# Patient Record
Sex: Male | Born: 1955 | Race: White | Hispanic: No | Marital: Married | Smoking: Former smoker
Health system: Southern US, Community
[De-identification: ages and names within clinical notes are randomized; demographics above are authoritative.]

## PROBLEM LIST (undated history)

## (undated) DIAGNOSIS — C61 Malignant neoplasm of prostate: Secondary | ICD-10-CM

## (undated) DIAGNOSIS — Z7189 Other specified counseling: Secondary | ICD-10-CM

## (undated) DIAGNOSIS — H9319 Tinnitus, unspecified ear: Secondary | ICD-10-CM

## (undated) DIAGNOSIS — C801 Malignant (primary) neoplasm, unspecified: Secondary | ICD-10-CM

## (undated) DIAGNOSIS — M1990 Unspecified osteoarthritis, unspecified site: Secondary | ICD-10-CM

## (undated) HISTORY — PX: BACK SURGERY: SHX140

## (undated) HISTORY — DX: Malignant (primary) neoplasm, unspecified: C80.1

## (undated) HISTORY — PX: KNEE ARTHROSCOPY: SUR90

---
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
## 2003-07-02 ENCOUNTER — Encounter: Payer: Self-pay | Admitting: Neurology

## 2003-07-02 ENCOUNTER — Encounter: Payer: Self-pay | Admitting: Cardiology

## 2003-07-02 ENCOUNTER — Encounter: Payer: Self-pay | Admitting: Emergency Medicine

## 2003-07-02 ENCOUNTER — Observation Stay (HOSPITAL_COMMUNITY): Admission: EM | Admit: 2003-07-02 | Discharge: 2003-07-03 | Payer: Self-pay | Admitting: Emergency Medicine

## 2005-03-29 ENCOUNTER — Ambulatory Visit (HOSPITAL_COMMUNITY): Admission: RE | Admit: 2005-03-29 | Discharge: 2005-03-29 | Payer: Self-pay | Admitting: Gastroenterology

## 2007-10-03 DIAGNOSIS — E782 Mixed hyperlipidemia: Secondary | ICD-10-CM | POA: Insufficient documentation

## 2009-12-24 ENCOUNTER — Ambulatory Visit (HOSPITAL_COMMUNITY): Admission: RE | Admit: 2009-12-24 | Discharge: 2009-12-24 | Payer: Self-pay | Admitting: Neurosurgery

## 2011-02-20 LAB — URINALYSIS, ROUTINE W REFLEX MICROSCOPIC
Bilirubin Urine: NEGATIVE
Glucose, UA: NEGATIVE mg/dL
Ketones, ur: NEGATIVE mg/dL
Urobilinogen, UA: 0.2 mg/dL (ref 0.0–1.0)
pH: 5.5 (ref 5.0–8.0)

## 2011-02-20 LAB — CBC
MCHC: 35.3 g/dL (ref 30.0–36.0)
RBC: 4.43 MIL/uL (ref 4.22–5.81)
RDW: 12.8 % (ref 11.5–15.5)
WBC: 6.9 10*3/uL (ref 4.0–10.5)

## 2011-02-20 LAB — BASIC METABOLIC PANEL
CO2: 27 mEq/L (ref 19–32)
Calcium: 9.1 mg/dL (ref 8.4–10.5)
Creatinine, Ser: 0.85 mg/dL (ref 0.4–1.5)
GFR calc non Af Amer: >60 mL/min (ref >60)
Glucose, Bld: 100 mg/dL — ABNORMAL HIGH (ref 70–99)

## 2011-02-20 LAB — PROTIME-INR: INR: 1.02 (ref 0.00–1.49)

## 2011-02-20 LAB — APTT: aPTT: 26 seconds (ref 24–37)

## 2011-04-22 NOTE — H&P (Signed)
NAMEMELVILLE, ENGEN                        ACCOUNT NO.:  [id number]   MEDICAL RECORD NO.:  [id number]                   PATIENT TYPE:  INP   LOCATION:                                       FACILITY:  [facility]   PHYSICIAN:  Santina Evans A. Orlin Hilding, M.D.          DATE OF BIRTH:  10-14-1956   DATE OF ADMISSION:  07/02/2003  DATE OF DISCHARGE:                                HISTORY & PHYSICAL   CHIEF COMPLAINT:  Staggering and slurred speech.   HISTORY OF PRESENT ILLNESS:  The patient is a 55 year old right-handed white  male with a history of hypertension in generally good health.  Last few days  he reports a little bit of choking on liquids, but otherwise well.  He went  to bed and felt fine last night.  Got up around 5:00 as usual this morning  and felt dizzy, unsteady, had slurred speech, double vision, and was  staggering.  He fell when he tried to walk.  He was nauseated and vomited  once.  Came to the emergency room by family car where he was noted to have  some nystagmus and left finger-nose dysmetria and slurred speech that has  been gradually clearing.   REVIEW OF SYSTEMS:  Negative for headache, chest pain, shortness of breath.  He is hard of hearing on the left.  He has had low back pain.   PAST MEDICAL HISTORY:  1. Hypertension.  2. Back pain.  3. Left leg surgery to correct bowing.  4. Hyperlipidemia which is diet controlled.   MEDICATIONS:  Something that starts with an H for his blood pressure.  I  tried hydrochlorothiazide.  They do not recognize that.   ALLERGIES:  No known drug allergies.   SOCIAL HISTORY:  He is married.  Remote cigarette use.  No alcohol or drug  use.  He is a Corporate investment banker.   FAMILY HISTORY:  Positive for hypertension.   PHYSICAL EXAMINATION:  VITAL SIGNS:  Temperature 98.9, pulse 72, blood  pressure 135/96, respirations 18, 97% saturation on 2 L.  HEENT:  Head is normocephalic, atraumatic.  NECK:  Supple without bruits.  HEART:   Regular rate and rhythm.  LUNGS:  Clear to auscultation.  ABDOMEN:  Benign.  EXTREMITIES:  Without edema.  NEUROLOGIC:  Mental status:  Awake, alert, normal naming, repetition, and  comprehension.  Cranial nerves II:  Pupils are equal and reactive.  Disk  margins are sharp.  Visual fields full to confrontation __________  .  Extraocular movements are intact without nystagmus, ophthalmoparesis or  ptosis.  Facial sensation is normal, facial motor activity is intact without  any weakness, droop, or asymmetry.  Hearing is diminished on the left,  normal on the right.  Palate is symmetric.  Tongue is midline.  He may have  had a few beats of palatal myoclonus.  It might have been voluntary,  however.  Shoulder shrug is normal.  On motor examination he has normal  bulk, tone, and strength throughout, mildly unsteady standing with a little  swaying back and forth.  Reflexes are trace in the upper and lower  extremities with downgoing toes to plantar stimulation.  Coordination:  Finger-to-nose, heel-to-shin, rapid alternating movements are normal.  I did  not try tandem gait because of the unsteadiness and swaying.  Sensory  examination is intact.   LABORATORIES:  CT scan of the brain is normal.  EKG is normal sinus rhythm.  Chest x-ray pending.  CBC and CMET are all essentially normal.  CK was  normal.  Troponin was less than 0.01.  PT 12.9, INR 1.0, PTT 26.   IMPRESSION:  Posterior circulation symptoms with dizziness, falls,  unsteadiness, dysarthria, diplopia, nystagmus, dysmetria, nausea, and  vomiting all rapidly clearing.  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  Need to rule out cerebellar or brain stem stroke.  Risk  factors hypertension, hypercholesterolemia.   PLAN:  Admit to stroke service for work-up.  MRI of the brain, MR angiogram,  2-D echocardiogram, carotid Doppler, urine drug  screen, and homocysteine.  Will need ST, OT, and PT evaluations and aspirin will be started on a  regular basis.                                                Catherine A. Orlin Hilding, M.D.    CAW/MEDQ  D:  07/02/2003  T:  07/02/2003  Job:  [id number]

## 2011-04-22 NOTE — Op Note (Signed)
Frederick Cruz, Frederick Cruz              ACCOUNT NO.:  [id number]   MEDICAL RECORD NO.:  [id number]          PATIENT TYPE:  AMB   LOCATION:  ENDO                         FACILITY:  [facility]   PHYSICIAN:  James L. Malon Kindle., M.D.DATE OF BIRTH:  [birth year]   DATE OF PROCEDURE:  03/29/2005  DATE OF DISCHARGE:                                 OPERATIVE REPORT   PROCEDURE PERFORMED:  Colonoscopy.   ENDOSCOPIST:  Llana Aliment. Edwards, M.D.   MEDICATIONS:  Fentanyl 75 mcg, Versed 10.5 mg IV.   INSTRUMENT USED:  Olympus pediatric adjustable colonoscope.   INDICATIONS FOR PROCEDURE:  Screening colonoscopy.   DESCRIPTION OF PROCEDURE:  The procedure had been explained to the patient  and consent obtained.  With the patient in the left lateral decubitus  position, the Olympus scope was inserted and advanced.  Prep excellent.  We  were able to advance to the cecum without difficulty.  The ileocecal valve  and appendiceal orifice were seen.  The scope was withdrawn and the cecum,  ascending colon, transverse colon, splenic flexure, descending and sigmoid  colon were seen well upon removal.  No polyps or other lesions seen.  The  rectum was seen well.  There were no polyps in the rectum.  The scope was  withdrawn.  The patient tolerated the procedure well.   ASSESSMENT:  Screening colonoscopy, normal, V76.51.   PLAN:  Will recommend yearly Hemoccults.  Repeat colonoscopy in 10 years.      JLE/MEDQ  D:  03/29/2005  T:  03/29/2005  Job:  [id number]   cc:   Schuyler Amor, M.D.  [address]  [place], [place] [address]  Fax: [phone]

## 2013-10-15 ENCOUNTER — Encounter (HOSPITAL_COMMUNITY): Payer: Self-pay | Admitting: Pharmacy Technician

## 2013-10-15 NOTE — H&P (Signed)
TOTAL KNEE ADMISSION H&P  Patient is being admitted for right total knee arthroplasty.  Subjective:  Chief Complaint:right knee pain.  HPI: Frederick Cruz, 57 y.o. male, has a history of pain and functional disability in the right knee due to arthritis and has failed non-surgical conservative treatments for greater than 12 weeks to includeNSAID's and/or analgesics, corticosteriod injections and activity modification.  Onset of symptoms was gradual, starting 1 year ago with gradually worsening course since that time. The patient noted no past surgery on the right knee(s).  Patient currently rates pain in the right knee(s) at 7 out of 10 with activity. Patient has night pain, worsening of pain with activity and weight bearing, pain that interferes with activities of daily living, pain with passive range of motion, crepitus and joint swelling.  Patient has evidence of periarticular osteophytes and joint space narrowing by imaging studies.  There is no active infection.  Past Medical History Osteoarthritis  Lumbar disc herniation  Past Surgical history Left knee arthroscopy Lumbar microdiscectomy  NKDA  History  Substance Use Topics  . Smoking status: Past Smoker  . Smokeless tobacco: None  . Alcohol Use: Rare    Family History Diabetes mellitus, type II Dementia Cerebrovascular accident   Review of Systems  Constitutional: Negative.   HENT: Positive for tinnitus. Negative for congestion, ear discharge, ear pain, hearing loss, nosebleeds and sore throat.   Eyes: Negative.   Respiratory: Negative.  Negative for stridor.   Cardiovascular: Negative.   Gastrointestinal: Negative.   Genitourinary: Negative.   Musculoskeletal: Positive for joint pain. Negative for back pain, falls, myalgias and neck pain.       Right knee pain  Neurological: Negative.  Negative for headaches.  Endo/Heme/Allergies: Negative.   Psychiatric/Behavioral: Negative.     Objective:  Physical Exam   Constitutional: He is oriented to person, place, and time. He appears well-developed and well-nourished. No distress.  HENT:  Head: Normocephalic and atraumatic.  Right Ear: External ear normal.  Left Ear: External ear normal.  Nose: Nose normal.  Mouth/Throat: Oropharynx is clear and moist.  Eyes: Conjunctivae and EOM are normal.  Neck: Normal range of motion. Neck supple.  Cardiovascular: Normal rate, regular rhythm, normal heart sounds and intact distal pulses.   No murmur heard. Respiratory: Breath sounds normal. No respiratory distress. He has no wheezes.  GI: Soft. Bowel sounds are normal. He exhibits no distension. There is no tenderness.  Musculoskeletal:       Right hip: Normal.       Left hip: Normal.       Right knee: He exhibits decreased range of motion and swelling. He exhibits no effusion and no erythema. Tenderness found. Medial joint line and lateral joint line tenderness noted.       Left knee: Normal.       Right lower leg: He exhibits no tenderness and no swelling.       Left lower leg: He exhibits no tenderness and no swelling.  Neurological: He is alert and oriented to person, place, and time. He has normal strength and normal reflexes. No sensory deficit.  Skin: No rash noted. He is not diaphoretic. No erythema.  Psychiatric: He has a normal mood and affect. His behavior is normal.    Vitals Weight: 220 lb Height: 69.5 in Body Surface Area: 2.21 m Body Mass Index: 32.02 kg/m Pulse: 68 (Regular) BP: 141/91 (Sitting, Left Arm, Standard)   Imaging Review Plain radiographs demonstrate severe degenerative joint disease of the right  knee(s). The overall alignment ismild varus. The bone quality appears to be good for age and reported activity level.  Assessment/Plan:  End stage arthritis, right knee   The patient history, physical examination, clinical judgment of the provider and imaging studies are consistent with end stage degenerative joint  disease of the right knee(s) and total knee arthroplasty is deemed medically necessary. The treatment options including medical management, injection therapy arthroscopy and arthroplasty were discussed at length. The risks and benefits of total knee arthroplasty were presented and reviewed. The risks due to aseptic loosening, infection, stiffness, patella tracking problems, thromboembolic complications and other imponderables were discussed. The patient acknowledged the explanation, agreed to proceed with the plan and consent was signed. Patient is being admitted for inpatient treatment for surgery, pain control, PT, OT, prophylactic antibiotics, VTE prophylaxis, progressive ambulation and ADL's and discharge planning. The patient is planning to be discharged home with home health services      [place], [place]

## 2013-10-16 ENCOUNTER — Other Ambulatory Visit (HOSPITAL_COMMUNITY): Payer: Self-pay | Admitting: *Deleted

## 2013-10-18 ENCOUNTER — Encounter (HOSPITAL_COMMUNITY)
Admission: RE | Admit: 2013-10-18 | Discharge: 2013-10-18 | Disposition: A | Discharge status: Home / Self Care | Payer: BC Managed Care – PPO | Source: Ambulatory Visit | Attending: Orthopedic Surgery | Admitting: Orthopedic Surgery

## 2013-10-18 ENCOUNTER — Encounter (HOSPITAL_COMMUNITY): Payer: Self-pay

## 2013-10-18 DIAGNOSIS — Z01812 Encounter for preprocedural laboratory examination: Secondary | ICD-10-CM | POA: Insufficient documentation

## 2013-10-18 HISTORY — DX: Unspecified osteoarthritis, unspecified site: M19.90

## 2013-10-18 HISTORY — DX: Tinnitus, unspecified ear: H93.19

## 2013-10-18 LAB — URINALYSIS, ROUTINE W REFLEX MICROSCOPIC
Bilirubin Urine: NEGATIVE
Glucose, UA: NEGATIVE mg/dL
Hgb urine dipstick: NEGATIVE
Ketones, ur: NEGATIVE mg/dL
Leukocytes, UA: NEGATIVE
Nitrite: NEGATIVE
Protein, ur: NEGATIVE mg/dL
Specific Gravity, Urine: 1.017 (ref 1.005–1.030)
Urobilinogen, UA: 0.2 mg/dL (ref 0.0–1.0)
pH: 7.5 (ref 5.0–8.0)

## 2013-10-18 LAB — CBC
HCT: 42.2 % (ref 39.0–52.0)
Hemoglobin: 14.9 g/dL (ref 13.0–17.0)
MCH: 31.8 pg (ref 26.0–34.0)
MCHC: 35.3 g/dL (ref 30.0–36.0)
Platelets: 228 10*3/uL (ref 150–400)
RBC: 4.69 MIL/uL (ref 4.22–5.81)

## 2013-10-18 LAB — SURGICAL PCR SCREEN
MRSA, PCR: INVALID — AB
Staphylococcus aureus: INVALID — AB

## 2013-10-18 LAB — COMPREHENSIVE METABOLIC PANEL
ALT: 18 U/L (ref 0–53)
AST: 21 U/L (ref 0–37)
Albumin: 4.2 g/dL (ref 3.5–5.2)
Alkaline Phosphatase: 55 U/L (ref 39–117)
BUN: 18 mg/dL (ref 6–23)
CO2: 25 mEq/L (ref 19–32)
Calcium: 9.3 mg/dL (ref 8.4–10.5)
Chloride: 104 mEq/L (ref 96–112)
Creatinine, Ser: 0.84 mg/dL (ref 0.50–1.35)
GFR calc Af Amer: >90 mL/min (ref >90)
GFR calc non Af Amer: >90 mL/min (ref >90)
Glucose, Bld: 86 mg/dL (ref 70–99)
Potassium: 4.6 mEq/L (ref 3.5–5.1)
Sodium: 137 mEq/L (ref 135–145)
Total Bilirubin: 0.3 mg/dL (ref 0.3–1.2)
Total Protein: 7.6 g/dL (ref 6.0–8.3)

## 2013-10-18 LAB — APTT: aPTT: 27 seconds (ref 24–37)

## 2013-10-18 LAB — PROTIME-INR
INR: 0.98 (ref 0.00–1.49)
Prothrombin Time: 12.8 seconds (ref 11.6–15.2)

## 2013-10-18 NOTE — Progress Notes (Signed)
10/18/13 1154  OBSTRUCTIVE SLEEP APNEA  Have you ever been diagnosed with sleep apnea through a sleep study? No  Do you snore loudly (loud enough to be heard through closed doors)?  1  Do you often feel tired, fatigued, or sleepy during the daytime? 1  Has anyone observed you stop breathing during your sleep? 0  Do you have, or are you being treated for high blood pressure? 0  BMI more than 35 kg/m2? 0  Age over 59 years old? 1  Neck circumference greater than 40 cm/18 inches? 1  Gender: 1  Obstructive Sleep Apnea Score 5  Score 4 or greater  Results sent to PCP

## 2013-10-18 NOTE — Patient Instructions (Addendum)
Frederick Cruz  10/18/2013                           YOUR PROCEDURE IS SCHEDULED ON:  10/23/13               PLEASE REPORT TO SHORT STAY CENTER AT : 6:00 AM               CALL THIS NUMBER IF ANY PROBLEMS THE DAY OF SURGERY :               [phone]                      REMEMBER:   Do not eat food or drink liquids AFTER MIDNIGHT   Take these medicines the morning of surgery with A SIP OF WATER:  NONE   Do not wear jewelry, make-up   Do not wear lotions, powders, or perfumes.   Do not shave legs or underarms 12 hrs. before surgery (men may shave face)  Do not bring valuables to the hospital.  Contacts, dentures or bridgework may not be worn into surgery.  Leave suitcase in the car. After surgery it may be brought to your room.  For patients admitted to the hospital more than one night, checkout time is 11:00                          The day of discharge.   Patients discharged the day of surgery will not be allowed to drive home                             If going home same day of surgery, must have someone stay with you first                           24 hrs at home and arrange for some one to drive you home from hospital.    Special Instructions:   Please read over the following fact sheets that you were given:               1. MRSA  INFORMATION                      2. [facility] PREPARING FOR SURGERY SHEET               3. INCENTIVE SPIROMETER                                                X_____________________________________________________________________        Failure to follow these instructions may result in cancellation of your surgery

## 2013-10-21 LAB — MRSA CULTURE

## 2013-10-23 ENCOUNTER — Ambulatory Visit (HOSPITAL_COMMUNITY): Payer: BC Managed Care – PPO | Admitting: Anesthesiology

## 2013-10-23 ENCOUNTER — Inpatient Hospital Stay (HOSPITAL_COMMUNITY)
Admission: RE | Admit: 2013-10-23 | Discharge: 2013-10-25 | DRG: 470 | Disposition: A | Discharge status: Home Health | Payer: BC Managed Care – PPO | Source: Ambulatory Visit | Attending: Orthopedic Surgery | Admitting: Orthopedic Surgery

## 2013-10-23 ENCOUNTER — Encounter (HOSPITAL_COMMUNITY): Payer: Self-pay | Admitting: *Deleted

## 2013-10-23 ENCOUNTER — Ambulatory Visit (HOSPITAL_COMMUNITY): Payer: BC Managed Care – PPO

## 2013-10-23 ENCOUNTER — Encounter (HOSPITAL_COMMUNITY): Payer: BC Managed Care – PPO | Admitting: Anesthesiology

## 2013-10-23 ENCOUNTER — Encounter (HOSPITAL_COMMUNITY)
Admission: RE | Disposition: A | Discharge status: Home Health | Payer: Self-pay | Source: Ambulatory Visit | Attending: Orthopedic Surgery

## 2013-10-23 DIAGNOSIS — Z8489 Family history of other specified conditions: Secondary | ICD-10-CM

## 2013-10-23 DIAGNOSIS — M1710 Unilateral primary osteoarthritis, unspecified knee: Principal | ICD-10-CM | POA: Diagnosis present

## 2013-10-23 DIAGNOSIS — Z823 Family history of stroke: Secondary | ICD-10-CM

## 2013-10-23 DIAGNOSIS — Z96651 Presence of right artificial knee joint: Secondary | ICD-10-CM

## 2013-10-23 DIAGNOSIS — M1711 Unilateral primary osteoarthritis, right knee: Secondary | ICD-10-CM | POA: Diagnosis present

## 2013-10-23 DIAGNOSIS — Z833 Family history of diabetes mellitus: Secondary | ICD-10-CM

## 2013-10-23 DIAGNOSIS — Z87891 Personal history of nicotine dependence: Secondary | ICD-10-CM

## 2013-10-23 DIAGNOSIS — D62 Acute posthemorrhagic anemia: Secondary | ICD-10-CM | POA: Diagnosis not present

## 2013-10-23 HISTORY — PX: TOTAL KNEE ARTHROPLASTY: SHX125

## 2013-10-23 LAB — TYPE AND SCREEN
ABO/RH(D): A POS
Antibody Screen: NEGATIVE

## 2013-10-23 SURGERY — ARTHROPLASTY, KNEE, TOTAL
Anesthesia: General | Site: Knee | Laterality: Right | Wound class: Clean

## 2013-10-23 MED ORDER — MIDAZOLAM HCL 5 MG/5ML IJ SOLN
INTRAMUSCULAR | Status: DC | PRN
  Administered 2013-10-23: 2 mg via INTRAVENOUS

## 2013-10-23 MED ORDER — ROCURONIUM BROMIDE 100 MG/10ML IV SOLN
INTRAVENOUS | Status: AC
  Filled 2013-10-23: qty 1

## 2013-10-23 MED ORDER — RIVAROXABAN 10 MG PO TABS: 10.0000 mg | ORAL_TABLET | Freq: Every day | ORAL | Status: DC

## 2013-10-23 MED ORDER — CHLORHEXIDINE GLUCONATE 4 % EX LIQD: 60.0000 mL | Freq: Once | CUTANEOUS | Status: DC

## 2013-10-23 MED ORDER — SODIUM CHLORIDE 0.9 % IJ SOLN
INTRAMUSCULAR | Status: AC
  Filled 2013-10-23: qty 50

## 2013-10-23 MED ORDER — HYDROMORPHONE HCL PF 1 MG/ML IJ SOLN
INTRAMUSCULAR | Status: AC
  Administered 2013-10-23: 0.5 mg via INTRAVENOUS
  Filled 2013-10-23: qty 1

## 2013-10-23 MED ORDER — SUCCINYLCHOLINE CHLORIDE 20 MG/ML IJ SOLN
INTRAMUSCULAR | Status: DC | PRN
  Administered 2013-10-23: 100 mg via INTRAVENOUS

## 2013-10-23 MED ORDER — BUPIVACAINE LIPOSOME 1.3 % IJ SUSP
20.0000 mL | Freq: Once | INTRAMUSCULAR | Status: DC
  Filled 2013-10-23: qty 20

## 2013-10-23 MED ORDER — SODIUM CHLORIDE 0.9 % IJ SOLN
INTRAMUSCULAR | Status: DC | PRN
  Administered 2013-10-23: 20 mL

## 2013-10-23 MED ORDER — HYDROMORPHONE HCL PF 1 MG/ML IJ SOLN
0.2500 mg | INTRAMUSCULAR | Status: DC | PRN
  Administered 2013-10-23 (×4): 0.5 mg via INTRAVENOUS

## 2013-10-23 MED ORDER — PROMETHAZINE HCL 25 MG/ML IJ SOLN
12.5000 mg | Freq: Four times a day (QID) | INTRAMUSCULAR | Status: DC | PRN
  Administered 2013-10-23 – 2013-10-24 (×2): 12.5 mg via INTRAVENOUS
  Filled 2013-10-23 (×2): qty 1

## 2013-10-23 MED ORDER — THROMBIN 5000 UNITS EX SOLR
CUTANEOUS | Status: AC
  Filled 2013-10-23: qty 10000

## 2013-10-23 MED ORDER — SODIUM CHLORIDE 0.9 % IR SOLN: Status: DC | PRN

## 2013-10-23 MED ORDER — POLYETHYLENE GLYCOL 3350 17 G PO PACK
17.0000 g | PACK | Freq: Every day | ORAL | Status: DC | PRN
  Filled 2013-10-23: qty 1

## 2013-10-23 MED ORDER — MIDAZOLAM HCL 2 MG/2ML IJ SOLN
INTRAMUSCULAR | Status: AC
  Filled 2013-10-23: qty 2

## 2013-10-23 MED ORDER — FERROUS SULFATE 325 (65 FE) MG PO TABS
325.0000 mg | ORAL_TABLET | Freq: Three times a day (TID) | ORAL | Status: DC
  Administered 2013-10-24 – 2013-10-25 (×4): 325 mg via ORAL
  Filled 2013-10-23 (×9): qty 1

## 2013-10-23 MED ORDER — SUCCINYLCHOLINE CHLORIDE 20 MG/ML IJ SOLN
INTRAMUSCULAR | Status: AC
  Filled 2013-10-23: qty 1

## 2013-10-23 MED ORDER — HYDRALAZINE HCL 20 MG/ML IJ SOLN
INTRAMUSCULAR | Status: AC
  Filled 2013-10-23: qty 1

## 2013-10-23 MED ORDER — ACETAMINOPHEN 325 MG PO TABS: 650.0000 mg | ORAL_TABLET | Freq: Four times a day (QID) | ORAL | Status: DC | PRN

## 2013-10-23 MED ORDER — ALUM & MAG HYDROXIDE-SIMETH 200-200-20 MG/5ML PO SUSP
30.0000 mL | ORAL | Status: DC | PRN
  Filled 2013-10-23: qty 30

## 2013-10-23 MED ORDER — CEFAZOLIN SODIUM-DEXTROSE 2-3 GM-% IV SOLR
INTRAVENOUS | Status: AC
  Filled 2013-10-23: qty 50

## 2013-10-23 MED ORDER — BISACODYL 10 MG RE SUPP
10.0000 mg | Freq: Every day | RECTAL | Status: DC | PRN
  Filled 2013-10-23: qty 1

## 2013-10-23 MED ORDER — FLEET ENEMA 7-19 GM/118ML RE ENEM
1.0000 | ENEMA | Freq: Once | RECTAL | Status: AC | PRN
  Filled 2013-10-23: qty 1

## 2013-10-23 MED ORDER — METHOCARBAMOL 500 MG PO TABS: 500.0000 mg | ORAL_TABLET | Freq: Four times a day (QID) | ORAL | Status: DC | PRN

## 2013-10-23 MED ORDER — OXYCODONE-ACETAMINOPHEN 5-325 MG PO TABS
2.0000 | ORAL_TABLET | ORAL | Status: DC | PRN
  Administered 2013-10-23: 1 via ORAL
  Administered 2013-10-24 – 2013-10-25 (×4): 2 via ORAL
  Filled 2013-10-23 (×5): qty 2

## 2013-10-23 MED ORDER — METHOCARBAMOL 500 MG PO TABS
500.0000 mg | ORAL_TABLET | Freq: Four times a day (QID) | ORAL | Status: DC | PRN
  Administered 2013-10-24 – 2013-10-25 (×3): 500 mg via ORAL
  Filled 2013-10-23 (×4): qty 1

## 2013-10-23 MED ORDER — OXYCODONE-ACETAMINOPHEN 5-325 MG PO TABS: 2.0000 | ORAL_TABLET | ORAL | Status: DC | PRN

## 2013-10-23 MED ORDER — ONDANSETRON HCL 4 MG/2ML IJ SOLN
INTRAMUSCULAR | Status: DC | PRN
  Administered 2013-10-23: 4 mg via INTRAVENOUS

## 2013-10-23 MED ORDER — HYDROCODONE-ACETAMINOPHEN 5-325 MG PO TABS
1.0000 | ORAL_TABLET | ORAL | Status: DC | PRN
  Administered 2013-10-24 – 2013-10-25 (×3): 2 via ORAL
  Filled 2013-10-23 (×3): qty 2

## 2013-10-23 MED ORDER — ROCURONIUM BROMIDE 100 MG/10ML IV SOLN
INTRAVENOUS | Status: DC | PRN
  Administered 2013-10-23: 30 mg via INTRAVENOUS

## 2013-10-23 MED ORDER — LIDOCAINE HCL (CARDIAC) 20 MG/ML IV SOLN
INTRAVENOUS | Status: AC
  Filled 2013-10-23: qty 5

## 2013-10-23 MED ORDER — NEOSTIGMINE METHYLSULFATE 1 MG/ML IJ SOLN
INTRAMUSCULAR | Status: AC
  Filled 2013-10-23: qty 10

## 2013-10-23 MED ORDER — HYDROMORPHONE HCL PF 1 MG/ML IJ SOLN
INTRAMUSCULAR | Status: AC
  Filled 2013-10-23: qty 1

## 2013-10-23 MED ORDER — MENTHOL 3 MG MT LOZG
1.0000 | LOZENGE | OROMUCOSAL | Status: DC | PRN
  Filled 2013-10-23: qty 9

## 2013-10-23 MED ORDER — CEFAZOLIN SODIUM-DEXTROSE 2-3 GM-% IV SOLR
2.0000 g | INTRAVENOUS | Status: AC
  Administered 2013-10-23: 2 g via INTRAVENOUS

## 2013-10-23 MED ORDER — PROMETHAZINE HCL 25 MG/ML IJ SOLN: 6.2500 mg | INTRAMUSCULAR | Status: DC | PRN

## 2013-10-23 MED ORDER — PHENOL 1.4 % MT LIQD
1.0000 | OROMUCOSAL | Status: DC | PRN
  Filled 2013-10-23: qty 177

## 2013-10-23 MED ORDER — GLYCOPYRROLATE 0.2 MG/ML IJ SOLN
INTRAMUSCULAR | Status: AC
  Filled 2013-10-23: qty 1

## 2013-10-23 MED ORDER — CELECOXIB 200 MG PO CAPS
200.0000 mg | ORAL_CAPSULE | Freq: Two times a day (BID) | ORAL | Status: DC
  Administered 2013-10-23 – 2013-10-25 (×4): 200 mg via ORAL
  Filled 2013-10-23 (×7): qty 1

## 2013-10-23 MED ORDER — GLYCOPYRROLATE 0.2 MG/ML IJ SOLN
INTRAMUSCULAR | Status: DC | PRN
  Administered 2013-10-23: 0.4 mg via INTRAVENOUS

## 2013-10-23 MED ORDER — HYDROMORPHONE HCL PF 1 MG/ML IJ SOLN
1.0000 mg | INTRAMUSCULAR | Status: DC | PRN
  Administered 2013-10-23 (×2): 0.5 mg via INTRAVENOUS
  Administered 2013-10-24: 1 mg via INTRAVENOUS
  Filled 2013-10-23 (×4): qty 1

## 2013-10-23 MED ORDER — ACETAMINOPHEN 650 MG RE SUPP
650.0000 mg | Freq: Four times a day (QID) | RECTAL | Status: DC | PRN
  Filled 2013-10-23: qty 1

## 2013-10-23 MED ORDER — NEOSTIGMINE METHYLSULFATE 1 MG/ML IJ SOLN
INTRAMUSCULAR | Status: DC | PRN
  Administered 2013-10-23: 3 mg via INTRAVENOUS

## 2013-10-23 MED ORDER — HYDRALAZINE HCL 20 MG/ML IJ SOLN
5.0000 mg | Freq: Four times a day (QID) | INTRAMUSCULAR | Status: DC | PRN
  Administered 2013-10-23 (×2): 5 mg via INTRAVENOUS

## 2013-10-23 MED ORDER — POLYMYXIN B SULFATE 500000 UNITS IJ SOLR
INTRAMUSCULAR | Status: DC | PRN
  Administered 2013-10-23: 09:00:00

## 2013-10-23 MED ORDER — PROPOFOL 10 MG/ML IV BOLUS
INTRAVENOUS | Status: AC
  Filled 2013-10-23: qty 20

## 2013-10-23 MED ORDER — SODIUM CHLORIDE 0.9 % IJ SOLN
INTRAMUSCULAR | Status: DC | PRN
  Administered 2013-10-23: 10:00:00

## 2013-10-23 MED ORDER — ONDANSETRON HCL 4 MG/2ML IJ SOLN
INTRAMUSCULAR | Status: AC
  Filled 2013-10-23: qty 2

## 2013-10-23 MED ORDER — PROPOFOL 10 MG/ML IV BOLUS
INTRAVENOUS | Status: DC | PRN
  Administered 2013-10-23: 180 mg via INTRAVENOUS

## 2013-10-23 MED ORDER — LACTATED RINGERS IV SOLN
INTRAVENOUS | Status: DC
  Administered 2013-10-23 (×2): via INTRAVENOUS

## 2013-10-23 MED ORDER — LACTATED RINGERS IV SOLN: INTRAVENOUS | Status: DC

## 2013-10-23 MED ORDER — RIVAROXABAN 10 MG PO TABS
10.0000 mg | ORAL_TABLET | Freq: Every day | ORAL | Status: DC
  Administered 2013-10-24 – 2013-10-25 (×2): 10 mg via ORAL
  Filled 2013-10-23 (×3): qty 1

## 2013-10-23 MED ORDER — FENTANYL CITRATE 0.05 MG/ML IJ SOLN
INTRAMUSCULAR | Status: DC | PRN
  Administered 2013-10-23: 50 ug via INTRAVENOUS
  Administered 2013-10-23 (×2): 100 ug via INTRAVENOUS

## 2013-10-23 MED ORDER — THROMBIN 5000 UNITS EX SOLR
CUTANEOUS | Status: DC | PRN
  Administered 2013-10-23: 10000 [IU] via TOPICAL

## 2013-10-23 MED ORDER — FENTANYL CITRATE 0.05 MG/ML IJ SOLN
INTRAMUSCULAR | Status: AC
  Filled 2013-10-23: qty 5

## 2013-10-23 MED ORDER — CEFAZOLIN SODIUM 1-5 GM-% IV SOLN
1.0000 g | Freq: Four times a day (QID) | INTRAVENOUS | Status: AC
  Administered 2013-10-23 (×2): 1 g via INTRAVENOUS
  Filled 2013-10-23 (×2): qty 50

## 2013-10-23 MED ORDER — METHOCARBAMOL 100 MG/ML IJ SOLN
500.0000 mg | Freq: Four times a day (QID) | INTRAVENOUS | Status: DC | PRN
  Administered 2013-10-23: 500 mg via INTRAVENOUS
  Filled 2013-10-23: qty 5

## 2013-10-23 MED ORDER — FERROUS SULFATE 325 (65 FE) MG PO TABS: 325.0000 mg | ORAL_TABLET | Freq: Three times a day (TID) | ORAL | Status: DC

## 2013-10-23 MED ORDER — ONDANSETRON HCL 4 MG/2ML IJ SOLN
4.0000 mg | Freq: Four times a day (QID) | INTRAMUSCULAR | Status: DC | PRN
  Administered 2013-10-23: 4 mg via INTRAVENOUS
  Filled 2013-10-23: qty 2

## 2013-10-23 MED ORDER — HYDROMORPHONE HCL PF 2 MG/ML IJ SOLN
INTRAMUSCULAR | Status: AC
  Filled 2013-10-23: qty 1

## 2013-10-23 MED ORDER — HYDROMORPHONE HCL PF 1 MG/ML IJ SOLN
INTRAMUSCULAR | Status: DC | PRN
  Administered 2013-10-23 (×2): 1 mg via INTRAVENOUS

## 2013-10-23 MED ORDER — ONDANSETRON HCL 4 MG PO TABS
4.0000 mg | ORAL_TABLET | Freq: Four times a day (QID) | ORAL | Status: DC | PRN
  Filled 2013-10-23: qty 1

## 2013-10-23 MED ORDER — LACTATED RINGERS IV SOLN
INTRAVENOUS | Status: DC
  Administered 2013-10-23: 100 mL/h via INTRAVENOUS
  Administered 2013-10-24 (×2): via INTRAVENOUS

## 2013-10-23 SURGICAL SUPPLY — 65 items
ADH SKN CLS APL DERMABOND .7 (GAUZE/BANDAGES/DRESSINGS) ×1
BAG ZIPLOCK 12X15 (MISCELLANEOUS) ×2 IMPLANT
BANDAGE ELASTIC 4 VELCRO ST LF (GAUZE/BANDAGES/DRESSINGS) ×2 IMPLANT
BANDAGE ELASTIC 6 VELCRO ST LF (GAUZE/BANDAGES/DRESSINGS) ×2 IMPLANT
BANDAGE ESMARK 6X9 LF (GAUZE/BANDAGES/DRESSINGS) IMPLANT
BLADE SAG 18X100X1.27 (BLADE) ×2 IMPLANT
BLADE SAW SGTL 11.0X1.19X90.0M (BLADE) ×2 IMPLANT
BNDG ESMARK 6X9 LF (GAUZE/BANDAGES/DRESSINGS)
BONE CEMENT GENTAMICIN (Cement) ×4 IMPLANT
CAPT RP KNEE ×2 IMPLANT
CEMENT BONE GENTAMICIN 40 (Cement) ×2 IMPLANT
CUFF TOURN SGL QUICK 34 (TOURNIQUET CUFF) ×2
CUFF TRNQT CYL 34X4X40X1 (TOURNIQUET CUFF) ×1 IMPLANT
DERMABOND ADVANCED (GAUZE/BANDAGES/DRESSINGS) ×1
DERMABOND ADVANCED .7 DNX12 (GAUZE/BANDAGES/DRESSINGS) ×1 IMPLANT
DRAPE EXTREMITY T 121X128X90 (DRAPE) ×2 IMPLANT
DRAPE INCISE IOBAN 66X45 STRL (DRAPES) IMPLANT
DRAPE LG THREE QUARTER DISP (DRAPES) ×2 IMPLANT
DRAPE POUCH INSTRU U-SHP 10X18 (DRAPES) ×2 IMPLANT
DRAPE U-SHAPE 47X51 STRL (DRAPES) ×2 IMPLANT
DRSG AQUACEL AG ADV 3.5X10 (GAUZE/BANDAGES/DRESSINGS) ×2 IMPLANT
DRSG PAD ABDOMINAL 8X10 ST (GAUZE/BANDAGES/DRESSINGS) IMPLANT
DRSG TEGADERM 4X4.75 (GAUZE/BANDAGES/DRESSINGS) ×2 IMPLANT
DURAPREP 26ML APPLICATOR (WOUND CARE) ×2 IMPLANT
ELECT REM PT RETURN 9FT ADLT (ELECTROSURGICAL) ×2
ELECTRODE REM PT RTRN 9FT ADLT (ELECTROSURGICAL) ×1 IMPLANT
EVACUATOR 1/8 PVC DRAIN (DRAIN) ×2 IMPLANT
FACESHIELD LNG OPTICON STERILE (SAFETY) ×10 IMPLANT
GAUZE SPONGE 2X2 8PLY STRL LF (GAUZE/BANDAGES/DRESSINGS) ×1 IMPLANT
GLOVE BIOGEL PI IND STRL 8 (GLOVE) ×1 IMPLANT
GLOVE BIOGEL PI INDICATOR 8 (GLOVE) ×1
GLOVE ECLIPSE 8.0 STRL XLNG CF (GLOVE) ×4 IMPLANT
GLOVE SURG SS PI 6.5 STRL IVOR (GLOVE) ×8 IMPLANT
GOWN PREVENTION PLUS LG XLONG (DISPOSABLE) ×4 IMPLANT
GOWN STRL REIN XL XLG (GOWN DISPOSABLE) ×4 IMPLANT
HANDPIECE INTERPULSE COAX TIP (DISPOSABLE) ×1
IMMOBILIZER KNEE 20 (SOFTGOODS) ×2
IMMOBILIZER KNEE 20 THIGH 36 (SOFTGOODS) ×1 IMPLANT
KIT BASIN OR (CUSTOM PROCEDURE TRAY) ×2 IMPLANT
MANIFOLD NEPTUNE II (INSTRUMENTS) ×2 IMPLANT
NEEDLE HYPO 22GX1.5 SAFETY (NEEDLE) ×2 IMPLANT
NS IRRIG 1000ML POUR BTL (IV SOLUTION) IMPLANT
PACK TOTAL JOINT (CUSTOM PROCEDURE TRAY) ×2 IMPLANT
PADDING CAST COTTON 6X4 STRL (CAST SUPPLIES) IMPLANT
POSITIONER SURGICAL ARM (MISCELLANEOUS) ×2 IMPLANT
SET HNDPC FAN SPRY TIP SCT (DISPOSABLE) ×1 IMPLANT
SPONGE GAUZE 2X2 STER 10/PKG (GAUZE/BANDAGES/DRESSINGS) ×1
SPONGE LAP 18X18 X RAY DECT (DISPOSABLE) ×2 IMPLANT
SPONGE SURGIFOAM ABS GEL 100 (HEMOSTASIS) ×2 IMPLANT
STAPLER VISISTAT 35W (STAPLE) IMPLANT
SUCTION FRAZIER 12FR DISP (SUCTIONS) ×2 IMPLANT
SUT BONE WAX W31G (SUTURE) ×2 IMPLANT
SUT MNCRL AB 4-0 PS2 18 (SUTURE) ×2 IMPLANT
SUT VIC AB 1 CT1 27 (SUTURE) ×2
SUT VIC AB 1 CT1 27XBRD ANTBC (SUTURE) ×2 IMPLANT
SUT VIC AB 2-0 CT1 27 (SUTURE) ×3
SUT VIC AB 2-0 CT1 TAPERPNT 27 (SUTURE) ×3 IMPLANT
SUT VLOC 180 0 24IN GS25 (SUTURE) ×2 IMPLANT
SYR 20CC LL (SYRINGE) ×2 IMPLANT
TOWEL OR 17X26 10 PK STRL BLUE (TOWEL DISPOSABLE) ×4 IMPLANT
TOWER CARTRIDGE SMART MIX (DISPOSABLE) ×2 IMPLANT
TRAY FOLEY CATH 14FRSI W/METER (CATHETERS) IMPLANT
TRAY FOLEY CATH 16FRSI W/METER (SET/KITS/TRAYS/PACK) ×2 IMPLANT
WATER STERILE IRR 1500ML POUR (IV SOLUTION) ×4 IMPLANT
WRAP KNEE MAXI GEL POST OP (GAUZE/BANDAGES/DRESSINGS) ×2 IMPLANT

## 2013-10-23 NOTE — Anesthesia Preprocedure Evaluation (Signed)
Anesthesia Evaluation  Patient identified by MRN, date of birth, ID band Patient awake    Reviewed: Allergy & Precautions, H&P , NPO status , Patient's Chart, lab work & pertinent test results  Airway       Dental  (+) Teeth Intact, Dental Advisory Given and Caps,    Pulmonary neg pulmonary ROS, former smoker,  breath sounds clear to auscultation  Pulmonary exam normal       Cardiovascular negative cardio ROS  Rhythm:Regular Rate:Normal     Neuro/Psych negative neurological ROS  negative psych ROS   GI/Hepatic negative GI ROS, Neg liver ROS,   Endo/Other  negative endocrine ROS  Renal/GU negative Renal ROS  negative genitourinary   Musculoskeletal negative musculoskeletal ROS (+)   Abdominal   Peds  Hematology negative hematology ROS (+)   Anesthesia Other Findings   Reproductive/Obstetrics                           Anesthesia Physical Anesthesia Plan  ASA: I  Anesthesia Plan: General   Post-op Pain Management:    Induction: Intravenous  Airway Management Planned: Oral ETT  Additional Equipment:   Intra-op Plan:   Post-operative Plan: Extubation in OR  Informed Consent: I have reviewed the patients History and Physical, chart, labs and discussed the procedure including the risks, benefits and alternatives for the proposed anesthesia with the patient or authorized representative who has indicated his/her understanding and acceptance.   Dental advisory given  Plan Discussed with: CRNA  Anesthesia Plan Comments:         Anesthesia Quick Evaluation

## 2013-10-23 NOTE — Transfer of Care (Signed)
Immediate Anesthesia Transfer of Care Note  Patient: Frederick Cruz  Procedure(s) Performed: Procedure(s): RIGHT TOTAL KNEE ARTHROPLASTY (Right)  Patient Location: PACU  Anesthesia Type:General  Level of Consciousness: awake, alert  and oriented  Airway & Oxygen Therapy: Patient Spontanous Breathing and Patient connected to face mask oxygen  Post-op Assessment: Report given to PACU RN and Post -op Vital signs reviewed and stable  Post vital signs: Reviewed and stable  Complications: No apparent anesthesia complications

## 2013-10-23 NOTE — Interval H&P Note (Signed)
History and Physical Interval Note:  10/23/2013 8:11 AM  Frederick Cruz  has presented today for surgery, with the diagnosis of osteoarthritis of the right knee  The various methods of treatment have been discussed with the patient and family. After consideration of risks, benefits and other options for treatment, the patient has consented to  Procedure(s): RIGHT TOTAL KNEE ARTHROPLASTY (Right) as a surgical intervention .  The patient's history has been reviewed, patient examined, no change in status, stable for surgery.  I have reviewed the patient's chart and labs.  Questions were answered to the patient's satisfaction.     [name]

## 2013-10-23 NOTE — Brief Op Note (Signed)
10/23/2013  10:18 AM  PATIENT:  Linard Millers Gillaspie  57 y.o. male  PRE-OPERATIVE DIAGNOSIS:  osteoarthritis of the right knee  POST-OPERATIVE DIAGNOSIS:  osteoarthritis of the right knee  PROCEDURE:  Procedure(s): RIGHT TOTAL KNEE ARTHROPLASTY (Right)  SURGEON:  Surgeon(s) and Role:    * Jacki Cones, MD - Primary  PHYSICIAN ASSISTANT:Amber Robbins PA   ASSISTANTS: Dimitri Ped PA   ANESTHESIA:   general  EBL:  Total I/O In: 1000 [I.V.:1000] Out: -   BLOOD ADMINISTERED:none  DRAINS: (one) Hemovact drain(s) in the Right Knee with  Suction Open   LOCAL MEDICATIONS USED:  BUPIVICAINE 20cc mixed with 20cc of Normal Saline  SPECIMEN:  No Specimen  DISPOSITION OF SPECIMEN:  N/A  COUNTS:  YES  TOURNIQUET:  * Missing tourniquet times found for documented tourniquets in log:  [id number] *  DICTATION: .Other Dictation: Dictation Number  [id number]  PLAN OF CARE: Admit to inpatient   PATIENT DISPOSITION:  Stable in OR   Delay start of Pharmacological VTE agent (>24hrs) due to surgical blood loss or risk of bleeding: yes

## 2013-10-23 NOTE — Anesthesia Postprocedure Evaluation (Signed)
Anesthesia Post Note  Patient: Frederick Cruz  Procedure(s) Performed: Procedure(s) (LRB): RIGHT TOTAL KNEE ARTHROPLASTY (Right)  Anesthesia type: General  Patient location: PACU  Post pain: Pain level controlled  Post assessment: Post-op Vital signs reviewed  Last Vitals:  Filed Vitals:   10/23/13 1400  BP: 156/100  Pulse: 73  Temp: 37 C  Resp: 14    Post vital signs: Reviewed  Level of consciousness: sedated  Complications: No apparent anesthesia complications

## 2013-10-23 NOTE — Op Note (Signed)
NAMEJEFFORY, SNELGROVE              ACCOUNT NO.:  [id number]  MEDICAL RECORD NO.:  [id number]  LOCATION:  [facility]                         FACILITY:  [facility]  PHYSICIAN:  Georges Lynch. Korri Ask, M.D.DATE OF BIRTH:  25-Jan-1956  DATE OF PROCEDURE:  10/23/2013 DATE OF DISCHARGE:                              OPERATIVE REPORT   SURGEON:  Georges Lynch. Darrelyn Hillock, M.D.  OPERATIVE ASSISTANT:  Dimitri Ped, PA.  PREOPERATIVE DIAGNOSIS:  Severe degenerative arthritis with bone on bone, right knee.  POSTOPERATIVE DIAGNOSIS:  Severe degenerative arthritis with bone on bone, right knee.  OPERATION:  Right total knee arthroplasty utilizing the DePuy system, all 3 components were cemented.  Gentamicin was used in the cement.  The sizes used was a size 5 right posterior cruciate sacrificing femoral component, tibial tray was a size 5, the tibial insert was a size 5 10 mm thickness rotating platform type, the patella was a size 38 with 3 pegs.  ANESTHESIA:  He had 2 g of IV Ancef.  PROCEDURE:  Under general anesthesia, routine orthopedic prep and draping of the right lower extremity was carried out.  Appropriate time- out was first carried out.  I also marked the appropriate right leg in the holding area.  At this time, the leg was exsanguinated and Esmarch tourniquet was elevated at 325 mmHg.  The knee was flexed and incision was made over the anterior aspect of the right knee.  Two flaps were created.  I then carried out a median parapatellar incision.  I reflected the patella laterally, flexed the knee, and did medial and lateral meniscectomies and excised the anterior and posterior cruciate ligaments.  Following that, the initial drill hole was made in the intercondylar notch, the canal finder was inserted.  I thoroughly irrigated out the femoral canal.  I then inserted my next jig and removed 12 mm thickness off the distal femur at this time.  Following that, I then went and prepared the tibia in the  usual fashion.  We made an initial drill hole in the tibial plateau, inserted the guide rod with the canal finer, irrigated out the canal, and then inserted our next jig, and I removed 6 mm thickness off the affected medial side of the tibia.  At this time, I had to do a medial release because of the severe contracture and genu varus.  Following that, I then inserted my spacer blocks and removed more of the soft tissue.  There were no other spurs that were noted to remove from the posterior condyles.  I then inserted my trial after I did the spacer blocks.  I then continued to prepare the tibia, and I cut my keel cut out of the tibial plateau in the usual fashion.  I then cut my notch cut out of the distal femur and the trial components were inserted.  We then reduced the knee and had excellent reduction with good extension and good flexion/extension, good medial and lateral stability.  At this time, the patella then was addressed. We did a resurfacing procedure on the patella in the usual fashion.  38 patella was used.  We made 3 drill holes in the patella.  Following that, we  inserted our trial patella, it fit very nicely.  We removed all trial components, thoroughly water picked out the knee, cemented all 3 components at the same time.  After the cement was hardened, we then removed all loose pieces of cement.  We water picked out the knee.  I inserted some thrombin-soaked Gelfoam posteriorly.  I also injected my 1st portion of my mixture of Exparel and normal saline.  We used half of the mixture in the soft tissue, the other half was used at the end of the procedure.  After we went through the trials as I mentioned, I inserted a permanent rotating platform size 5 10 mm thickness insert, reduced the knee.  We initially were a little tight again, I did a further medial release.  We had then good stability as I mentioned.  Good flexion and extension.  I then irrigated out the knee,  inserted the Hemovac drain, and closed the knee in layers in usual fashion.  The remaining part of the Exparel was used at the end of the procedure.          ______________________________ Georges Lynch. Darrelyn Hillock, M.D.     RAG/MEDQ  D:  10/23/2013  T:  10/23/2013  Job:  [id number]

## 2013-10-23 NOTE — Progress Notes (Signed)
PT Cancellation Note  Patient Details Name: CHRISTOS MIXSON MRN: [id number] DOB: 1956-05-01   Cancelled Treatment:     POD 0 deferred at request of RN 2* pt nausea.  Will follow in am.   Tyrell Seifer 10/23/2013, 4:15 PM

## 2013-10-24 ENCOUNTER — Encounter (HOSPITAL_COMMUNITY): Payer: Self-pay | Admitting: Orthopedic Surgery

## 2013-10-24 LAB — BASIC METABOLIC PANEL
CO2: 24 mEq/L (ref 19–32)
Chloride: 98 mEq/L (ref 96–112)
GFR calc Af Amer: >90 mL/min (ref >90)
GFR calc non Af Amer: >90 mL/min (ref >90)
Glucose, Bld: 150 mg/dL — ABNORMAL HIGH (ref 70–99)
Potassium: 4.4 mEq/L (ref 3.5–5.1)
Sodium: 133 mEq/L — ABNORMAL LOW (ref 135–145)

## 2013-10-24 LAB — CBC
Hemoglobin: 12.1 g/dL — ABNORMAL LOW (ref 13.0–17.0)
MCH: 31.5 pg (ref 26.0–34.0)
Platelets: 199 10*3/uL (ref 150–400)
RBC: 3.84 MIL/uL — ABNORMAL LOW (ref 4.22–5.81)
WBC: 13.7 10*3/uL — ABNORMAL HIGH (ref 4.0–10.5)

## 2013-10-24 NOTE — Progress Notes (Signed)
Subjective: 1 Day Post-Op Procedure(s) (LRB): RIGHT TOTAL KNEE ARTHROPLASTY (Right) Patient reports pain as 2 on 0-10 scale.  Doing well and hemovac Dcd.No wound problems. Will Dc tomorrow.  Objective: Vital signs in last 24 hours: Temp:  [97 F (36.1 C)-98.8 F (37.1 C)] 97.3 F (36.3 C) (11/20 0515) Pulse Rate:  [54-108] 92 (11/20 0515) Resp:  [3-16] 16 (11/20 0515) BP: (129-200)/(82-136) 141/82 mmHg (11/20 0515) SpO2:  [94 %-100 %] 99 % (11/20 0515) Weight:  [100.699 kg (222 lb)] 100.699 kg (222 lb) (11/19 1454)  Intake/Output from previous day: 11/19 0701 - 11/20 0700 In: 3976.7 [P.O.:510; I.V.:3361.7; IV Piggyback:105] Out: 2803 [Urine:1910; Drains:743; Blood:150] Intake/Output this shift:     Recent Labs  10/24/13 0411  HGB 12.1*    Recent Labs  10/24/13 0411  WBC 13.7*  RBC 3.84*  HCT 34.8*  PLT 199    Recent Labs  10/24/13 0411  NA 133*  K 4.4  CL 98  CO2 24  BUN 18  CREATININE 0.87  GLUCOSE 150*  CALCIUM 8.5   No results found for this basename: LABPT, INR,  in the last 72 hours  Neurovascular intact Dorsiflexion/Plantar flexion intact No cellulitis present  Assessment/Plan: 1 Day Post-Op Procedure(s) (LRB): RIGHT TOTAL KNEE ARTHROPLASTY (Right) Up with therapy  Chaya Dehaan A 10/24/2013, 7:41 AM

## 2013-10-24 NOTE — Care Management Note (Addendum)
    Page 1 of 2   10/25/2013     1:38:48 PM   CARE MANAGEMENT NOTE 10/25/2013  Patient:  Frederick Cruz, Frederick Cruz   Account Number:  [id number]  Date Initiated:  10/24/2013  Documentation initiated by:  Colleen Can  Subjective/Objective Assessment:   DX RT TOTAL KNEE REPLACEMNT    Referral to [facility] from doctor's office.  Genevieve Norlander will provide HHpt services with start of day after discharge.     Action/Plan:   PT IS CURRENTLY ASLEEP. SPOUSE AT BEDSIDE. CM SPOKE WITH SPOUSE WHO WILL BE CAREGIVER. PLANS ARE FOR PT TO RETURN TO HIS HOME IN [place].   Anticipated DC Date:  10/25/2013   Anticipated DC Plan:  HOME W HOME HEALTH SERVICES      DC Planning Services  CM consult      [facility] Choice  HOME HEALTH  DURABLE MEDICAL EQUIPMENT   Choice offered to / List presented to:  C-3 Spouse   DME arranged  3-N-1  Levan Hurst      DME agency  [facility]     HH arranged  HH-2 PT      [facility] agency  [facility]   Status of service:  Completed, signed off Medicare Important Message given?   (If response is "NO", the following Medicare IM given date fields will be blank) Date Medicare IM given:   Date Additional Medicare IM given:    Discharge Disposition:  HOME W HOME HEALTH SERVICES  Per UR Regulation:  Reviewed for med. necessity/level of care/duration of stay  If discussed at Long Length of Stay Meetings, dates discussed:    Comments:

## 2013-10-24 NOTE — Evaluation (Signed)
Occupational Therapy Evaluation Patient Details Name: Frederick Cruz MRN: [id number] DOB: 1956/11/13 Today's Date: 10/24/2013 Time: 4403-4742 OT Time Calculation (min): 23 min  OT Assessment / Plan / Recommendation History of present illness Pt is s/p R TKR   Clinical Impression   Pt doing well. Wife present and educated on toilet transfers, when to wear KI, LB dressing and DME. Will benefit from continued OT services to improve ADL independence for d/c home.    OT Assessment  Patient needs continued OT Services    Follow Up Recommendations  No OT follow up;Supervision/Assistance - 24 hour    Barriers to Discharge      Equipment Recommendations  3 in 1 bedside comode (in room delivered)    Recommendations for Other Services    Frequency  Min 2X/week    Precautions / Restrictions Precautions Precautions: Fall;Knee Required Braces or Orthoses: Knee Immobilizer - Right Knee Immobilizer - Right: Discontinue once straight leg raise with < 10 degree lag Restrictions Weight Bearing Restrictions: No Other Position/Activity Restrictions: WBAT   Pertinent Vitals/Pain 6/10 after activity; rest, ice R knee    ADL  Eating/Feeding: Independent Where Assessed - Eating/Feeding: Chair Grooming: Wash/dry hands;Set up Where Assessed - Grooming: Supported sitting Upper Body Bathing: Chest;Right arm;Left arm;Abdomen;Set up Where Assessed - Upper Body Bathing: Unsupported sitting Lower Body Bathing: Minimal assistance Where Assessed - Lower Body Bathing: Supported sit to stand Upper Body Dressing: Set up Where Assessed - Upper Body Dressing: Unsupported sitting Lower Body Dressing: Minimal assistance Where Assessed - Lower Body Dressing: Supported sit to stand Toilet Transfer: Minimal assistance Acupuncturist: Raised toilet seat with arms (or 3-in-1 over toilet) Toileting - Clothing Manipulation and Hygiene: Minimal assistance Where Assessed - Medical sales representative and Hygiene: Sit to stand from 3-in-1 or toilet Equipment Used: Rolling walker;Knee Immobilizer    OT Diagnosis: Generalized weakness  OT Problem List: Decreased strength;Decreased knowledge of use of DME or AE OT Treatment Interventions: Self-care/ADL training;DME and/or AE instruction;Therapeutic activities;Patient/family education   OT Goals(Current goals can be found in the care plan section) Acute Rehab OT Goals Patient Stated Goal: Resume previous lifestyle with decreased pain OT Goal Formulation: With patient Time For Goal Achievement: 10/31/13 Potential to Achieve Goals: Good  Visit Information  Last OT Received On: 10/24/13 Assistance Needed: +1 History of Present Illness: Pt is s/p R TKR       Prior Functioning     Home Living Family/patient expects to be discharged to:: Private residence Living Arrangements: Spouse/significant other Available Help at Discharge: Family Type of Home: House Home Access: Stairs to enter Secretary/administrator of Steps: 3 Entrance Stairs-Rails: None Home Layout: One level Home Equipment: Crutches Prior Function Level of Independence: Independent Communication Communication: No difficulties         Vision/Perception     Cognition  Cognition Arousal/Alertness: Awake/alert Behavior During Therapy: WFL for tasks assessed/performed Overall Cognitive Status: Within Functional Limits for tasks assessed    Extremity/Trunk Assessment Upper Extremity Assessment Upper Extremity Assessment: Overall WFL for tasks assessed     Mobility  Transfers Transfers: Sit to Stand;Stand to Sit Sit to Stand: 4: Min assist;With upper extremity assist;From chair/3-in-1 Stand to Sit: 4: Min assist;With upper extremity assist;To chair/3-in-1 Details for Transfer Assistance: cues for hand placement and LE management        Balance Balance Balance Assessed: Yes Dynamic Standing Balance Dynamic Standing - Level of Assistance: 4:  Min assist   End of Session OT - End of Session  Equipment Utilized During Treatment: Rolling walker;Right knee immobilizer Activity Tolerance: Patient tolerated treatment well Patient left: in chair;with call bell/phone within reach;with family/visitor present  GO     Lennox Laity [phone] 10/24/2013, 2:10 PM

## 2013-10-24 NOTE — Progress Notes (Signed)
Physical Therapy Treatment Patient Details Name: Frederick Cruz MRN: [id number] DOB: 03/11/1956 Today's Date: 10/24/2013 Time: 7829-5621 PT Time Calculation (min): 15 min  PT Assessment / Plan / Recommendation  History of Present Illness     PT Comments     Follow Up Recommendations  Home health PT     Does the patient have the potential to tolerate intense rehabilitation     Barriers to Discharge        Equipment Recommendations  Rolling walker with 5" wheels    Recommendations for Other Services OT consult  Frequency 7X/week   Progress towards PT Goals Progress towards PT goals: Progressing toward goals  Plan Current plan remains appropriate    Precautions / Restrictions Precautions Precautions: Fall Required Braces or Orthoses: Knee Immobilizer - Right Knee Immobilizer - Right: Discontinue once straight leg raise with < 10 degree lag Restrictions Weight Bearing Restrictions: No Other Position/Activity Restrictions: WBAT   Pertinent Vitals/Pain 6/10; premed,     Mobility  Bed Mobility Bed Mobility: Supine to Sit Supine to Sit: 4: Min assist Details for Bed Mobility Assistance: cues for sequence and use of L LE to self assist Transfers Transfers: Sit to Stand;Stand to Sit Sit to Stand: 4: Min assist Stand to Sit: 4: Min assist Details for Transfer Assistance: cues for LE management and use of UEs to self assist Ambulation/Gait Ambulation/Gait Assistance: 4: Min assist Ambulation Distance (Feet): 140 Feet Assistive device: Rolling walker Ambulation/Gait Assistance Details: cues for sequence, posture and position from RW Gait Pattern: Step-to pattern;Decreased step length - right;Decreased step length - left;Shuffle;Antalgic;Trunk flexed General Gait Details: cues for sequence, posture, position from RW Stairs: No    Exercises Total Joint Exercises Ankle Circles/Pumps: AROM;Both;15 reps;Supine Quad Sets: AROM;Both;10 reps;Supine Heel Slides:  AAROM;Right;Supine;10 reps Straight Leg Raises: AAROM;Right;10 reps;Supine   PT Diagnosis: Difficulty walking  PT Problem List: Decreased strength;Decreased range of motion;Decreased activity tolerance;Decreased mobility;Decreased knowledge of use of DME;Pain PT Treatment Interventions: DME instruction;Gait training;Stair training;Functional mobility training;Therapeutic activities;Therapeutic exercise;Patient/family education   PT Goals (current goals can now be found in the care plan section) Acute Rehab PT Goals Patient Stated Goal: Resume previous lifestyle with decreased pain PT Goal Formulation: With patient Time For Goal Achievement: 10/31/13 Potential to Achieve Goals: Good  Visit Information  Last PT Received On: 10/24/13 Assistance Needed: +1    Subjective Data  Patient Stated Goal: Resume previous lifestyle with decreased pain   Cognition  Cognition Arousal/Alertness: Awake/alert Behavior During Therapy: WFL for tasks assessed/performed Overall Cognitive Status: Within Functional Limits for tasks assessed    Balance     End of Session PT - End of Session Equipment Utilized During Treatment: Gait belt;Right knee immobilizer Activity Tolerance: Patient tolerated treatment well Patient left: in chair;with call bell/phone within reach Nurse Communication: Mobility status   GP     Frederick Cruz 10/24/2013, 1:39 PM

## 2013-10-24 NOTE — Evaluation (Signed)
Physical Therapy Evaluation Patient Details Name: Frederick Cruz MRN: [id number] DOB: Mar 12, 1956 Today's Date: 10/24/2013 Time: 1019-1050 PT Time Calculation (min): 31 min  PT Assessment / Plan / Recommendation History of Present Illness     Clinical Impression  Pt s/p R TKR presents with decreased R LE strength/ROM and post op pain limiting functional mobility.  Pt should progress to d/c home with family assist and HHPT follow up.    PT Assessment  Patient needs continued PT services    Follow Up Recommendations  Home health PT    Does the patient have the potential to tolerate intense rehabilitation      Barriers to Discharge        Equipment Recommendations  Rolling walker with 5" wheels    Recommendations for Other Services OT consult   Frequency 7X/week    Precautions / Restrictions Precautions Precautions: Fall Required Braces or Orthoses: Knee Immobilizer - Right Knee Immobilizer - Right: Discontinue once straight leg raise with < 10 degree lag Restrictions Weight Bearing Restrictions: No Other Position/Activity Restrictions: WBAT   Pertinent Vitals/Pain 6/10; premed, cold packs provided      Mobility  Bed Mobility Bed Mobility: Supine to Sit Supine to Sit: 4: Min assist Details for Bed Mobility Assistance: cues for sequence and use of L LE to self assist Transfers Transfers: Sit to Stand;Stand to Sit Sit to Stand: 4: Min assist Stand to Sit: 4: Min assist Details for Transfer Assistance: cues for LE management and use of UEs to self assist Ambulation/Gait Ambulation/Gait Assistance: 4: Min assist Ambulation Distance (Feet): 50 Feet Assistive device: Rolling walker Ambulation/Gait Assistance Details: cues for sequence, posture and position from RW Gait Pattern: Step-to pattern;Decreased step length - right;Decreased step length - left;Shuffle;Antalgic;Trunk flexed General Gait Details: cues for sequence, posture, position from RW Stairs: No     Exercises Total Joint Exercises Ankle Circles/Pumps: AROM;Both;15 reps;Supine Quad Sets: AROM;Both;10 reps;Supine Heel Slides: AAROM;Right;Supine;10 reps Straight Leg Raises: AAROM;Right;10 reps;Supine   PT Diagnosis: Difficulty walking  PT Problem List: Decreased strength;Decreased range of motion;Decreased activity tolerance;Decreased mobility;Decreased knowledge of use of DME;Pain PT Treatment Interventions: DME instruction;Gait training;Stair training;Functional mobility training;Therapeutic activities;Therapeutic exercise;Patient/family education     PT Goals(Current goals can be found in the care plan section) Acute Rehab PT Goals Patient Stated Goal: Resume previous lifestyle with decreased pain PT Goal Formulation: With patient Time For Goal Achievement: 10/31/13 Potential to Achieve Goals: Good  Visit Information  Last PT Received On: 10/24/13 Assistance Needed: +1       Prior Functioning  Home Living Family/patient expects to be discharged to:: Private residence Living Arrangements: Spouse/significant other Available Help at Discharge: Family Type of Home: House Home Access: Stairs to enter Secretary/administrator of Steps: 3 Entrance Stairs-Rails: None Home Layout: One level Home Equipment: Crutches Prior Function Level of Independence: Independent Communication Communication: No difficulties    Cognition  Cognition Arousal/Alertness: Awake/alert Behavior During Therapy: WFL for tasks assessed/performed Overall Cognitive Status: Within Functional Limits for tasks assessed    Extremity/Trunk Assessment Upper Extremity Assessment Upper Extremity Assessment: Overall WFL for tasks assessed Lower Extremity Assessment Lower Extremity Assessment: RLE deficits/detail RLE Deficits / Details: 2/5 Quads with AAROM at knee -10 - 60   Balance    End of Session PT - End of Session Equipment Utilized During Treatment: Gait belt;Right knee immobilizer Activity  Tolerance: Patient tolerated treatment well Patient left: in chair;with call bell/phone within reach Nurse Communication: Mobility status  GP     Frederick Cruz 10/24/2013,  12:25 PM

## 2013-10-25 DIAGNOSIS — D62 Acute posthemorrhagic anemia: Secondary | ICD-10-CM | POA: Diagnosis not present

## 2013-10-25 LAB — BASIC METABOLIC PANEL
BUN: 18 mg/dL (ref 6–23)
Calcium: 8.6 mg/dL (ref 8.4–10.5)
GFR calc Af Amer: >90 mL/min (ref >90)
GFR calc non Af Amer: >90 mL/min (ref >90)
Potassium: 4.1 mEq/L (ref 3.5–5.1)
Sodium: 134 mEq/L — ABNORMAL LOW (ref 135–145)

## 2013-10-25 LAB — CBC
MCHC: 34.6 g/dL (ref 30.0–36.0)
Platelets: 173 10*3/uL (ref 150–400)
RDW: 12.5 % (ref 11.5–15.5)
WBC: 11.8 10*3/uL — ABNORMAL HIGH (ref 4.0–10.5)

## 2013-10-25 NOTE — Discharge Summary (Signed)
Physician Discharge Summary  Patient ID: Frederick Cruz MRN: [id number] DOB/AGE: March 03, 1956 57 y.o.  Admit date: 10/23/2013 Discharge date: 10/25/2013  Admission Diagnoses:Osteoarthritis Right Knee  Discharge Diagnoses: Osteoarthritis Right Knee Active Problems:   Osteoarthritis of right knee   Acute blood loss anemia   Discharged Condition: good  Hospital Course: Ambulating with PT. No post-op Problems  Consults: rehabilitation medicine  Significant Diagnostic Studies: labs: Post-op Follow-up Hbg and Xrays.  Treatments: anticoagulation: Xarelto  Discharge Exam: Blood pressure 126/82, pulse 96, temperature 98 F (36.7 C), temperature source Oral, resp. rate 16, height 5\' 9"  (1.753 m), weight 100.699 kg (222 lb), SpO2 95.00%. Extremities: Homans sign is negative, no sign of DVT  Disposition:   Discharge Orders   Future Orders Complete By Expires   Call MD / Call 911  As directed    Comments:     If you experience chest pain or shortness of breath, CALL 911 and be transported to the hospital emergency room.  If you develope a fever above 101 F, pus (white drainage) or increased drainage or redness at the wound, or calf pain, call your surgeon's office.   Constipation Prevention  As directed    Comments:     Drink plenty of fluids.  Prune juice may be helpful.  You may use a stool softener, such as Colace (over the counter) 100 mg twice a day.  Use MiraLax (over the counter) for constipation as needed.   Diet general  As directed    Discharge instructions  As directed    Comments:     Walk with your walker. Weight bearing as tolerated Home Health Agency will follow you at home for your therapy  Do not change your dressing unless there is excess drainage. Shower only, no tub bath. Call if any temperatures greater than 101 or any wound complications: [phone] during the day and ask for Dr. Jeannetta Ellis nurse, [facility].   Do not put a pillow under the knee. Place it  under the heel.  As directed    Driving restrictions  As directed    Comments:     No driving   Increase activity slowly as tolerated  As directed        Medication List    STOP taking these medications       multivitamin with minerals Tabs tablet      TAKE these medications       ferrous sulfate 325 (65 FE) MG tablet  Take 1 tablet (325 mg total) by mouth 3 (three) times daily after meals.     methocarbamol 500 MG tablet  Commonly known as:  ROBAXIN  Take 1 tablet (500 mg total) by mouth every 6 (six) hours as needed for muscle spasms.     oxyCODONE-acetaminophen 5-325 MG per tablet  Commonly known as:  PERCOCET/ROXICET  Take 2 tablets by mouth every 4 (four) hours as needed for severe pain.     rivaroxaban 10 MG Tabs tablet  Commonly known as:  XARELTO  Take 1 tablet (10 mg total) by mouth daily with breakfast.           Follow-up Information   Follow up with Klare Criss A, MD. Schedule an appointment as soon as possible for a visit in 2 weeks.   Specialty:  Orthopedic Surgery   Contact information:   [address] [place] [address] [phone]       Signed: Jacki Cones 10/25/2013, 8:31 AM

## 2013-10-25 NOTE — Progress Notes (Signed)
Occupational Therapy Treatment Patient Details Name: Frederick Cruz MRN: [id number] DOB: 04/22/1956 Today's Date: 10/25/2013 Time: 1001-1015 OT Time Calculation (min): 14 min  OT Assessment / Plan / Recommendation  History of present illness Pt is s/p R TKR   OT comments  Pt doing well. Supposed to d/c today. Educated on shower transfers and DME safety. No further questions or concerns.   Follow Up Recommendations  No OT follow up;Supervision/Assistance - 24 hour    Barriers to Discharge       Equipment Recommendations  3 in 1 bedside comode    Recommendations for Other Services    Frequency Min 2X/week   Progress towards OT Goals Progress towards OT goals: Progressing toward goals  Plan Discharge plan remains appropriate    Precautions / Restrictions Precautions Precautions: Fall;Knee Restrictions Weight Bearing Restrictions: No Other Position/Activity Restrictions: WBAT   Pertinent Vitals/Pain 510 R knee; reposition, ice    ADL  Toilet Transfer: Min guard Tub/Shower Transfer: Min guard Equipment Used: Rolling walker ADL Comments: Pt did well with stepping over shower ledge. Wife instructed on how to assist with steadying walker and practiced doing so. Discussed in length how to place 3in1 in shower for safety and RW use if it will go through shower opening. Also discussed where to place 3in1 if walker wont go through opening. Pt with no other questions/concerns.     OT Diagnosis:    OT Problem List:   OT Treatment Interventions:     OT Goals(current goals can now be found in the care plan section)    Visit Information  Last OT Received On: 10/25/13 History of Present Illness: Pt is s/p R TKR    Subjective Data      Prior Functioning       Cognition  Cognition Arousal/Alertness: Awake/alert Behavior During Therapy: WFL for tasks assessed/performed Overall Cognitive Status: Within Functional Limits for tasks assessed    Mobility   Transfers Transfers: Sit to Stand;Stand to Sit Sit to Stand: 4: Min guard;With upper extremity assist;From chair/3-in-1 Stand to Sit: 4: Min guard;With upper extremity assist;To chair/3-in-1    Exercises      Balance     End of Session OT - End of Session Equipment Utilized During Treatment: Rolling walker Activity Tolerance: Patient tolerated treatment well Patient left: in chair;with call bell/phone within reach;with family/visitor present  GO     Lennox Laity [phone] 10/25/2013, 11:04 AM

## 2013-10-25 NOTE — Progress Notes (Signed)
Subjective: 2 Days Post-Op Procedure(s) (LRB): RIGHT TOTAL KNEE ARTHROPLASTY (Right) Patient reports pain as 1 on 0-10 scale. Doing very well today. No wound issues.Will DC today.   Objective: Vital signs in last 24 hours: Temp:  [98 F (36.7 C)-100.2 F (37.9 C)] 98 F (36.7 C) (11/21 0550) Pulse Rate:  [95-106] 96 (11/21 0550) Resp:  [14-16] 16 (11/21 0800) BP: (126-146)/(82-94) 126/82 mmHg (11/21 0550) SpO2:  [95 %-96 %] 95 % (11/21 0550)  Intake/Output from previous day: 11/20 0701 - 11/21 0700 In: 2588.3 [P.O.:1080; I.V.:1508.3] Out: 400 [Urine:400] Intake/Output this shift:     Recent Labs  10/24/13 0411 10/25/13 0415  HGB 12.1* 10.3*    Recent Labs  10/24/13 0411 10/25/13 0415  WBC 13.7* 11.8*  RBC 3.84* 3.29*  HCT 34.8* 29.8*  PLT 199 173    Recent Labs  10/24/13 0411 10/25/13 0415  NA 133* 134*  K 4.4 4.1  CL 98 102  CO2 24 23  BUN 18 18  CREATININE 0.87 0.86  GLUCOSE 150* 137*  CALCIUM 8.5 8.6   No results found for this basename: LABPT, INR,  in the last 72 hours  Dorsiflexion/Plantar flexion intact No cellulitis present Compartment soft  Assessment/Plan: 2 Days Post-Op Procedure(s) (LRB): RIGHT TOTAL KNEE ARTHROPLASTY (Right) Discharge home with home health  Sausha Raymond A 10/25/2013, 8:29 AM

## 2013-10-25 NOTE — Progress Notes (Signed)
Pt to d/c home. AVS reviewed and "My Chart" discussed with pt. Pt capable of verbalizing medications and follow-up appointments. Remains hemodynamically stable. No signs and symptoms of distress. Educated pt to return to ER in the case of SOB, dizziness, or chest pain.  

## 2013-10-25 NOTE — Progress Notes (Signed)
Physical Therapy Treatment Patient Details Name: Frederick Cruz MRN: [id number] DOB: 1956-03-04 Today's Date: 10/25/2013 Time: 9562-1308 PT Time Calculation (min): 63 min  PT Assessment / Plan / Recommendation  History of Present Illness Pt is s/p R TKR   PT Comments     Follow Up Recommendations  Home health PT     Does the patient have the potential to tolerate intense rehabilitation     Barriers to Discharge        Equipment Recommendations  Rolling walker with 5" wheels    Recommendations for Other Services OT consult  Frequency 7X/week   Progress towards PT Goals Progress towards PT goals: Progressing toward goals  Plan Current plan remains appropriate    Precautions / Restrictions Precautions Precautions: Fall;Knee Required Braces or Orthoses: Knee Immobilizer - Right Knee Immobilizer - Right: Discontinue once straight leg raise with < 10 degree lag (Pt performed IND SLR this am) Restrictions Weight Bearing Restrictions: No Other Position/Activity Restrictions: WBAT   Pertinent Vitals/Pain 5/10; premed, cold packs provided    Mobility  Bed Mobility Bed Mobility: Supine to Sit Supine to Sit: 5: Supervision Details for Bed Mobility Assistance: cues for sequence and use of L LE to self assist Transfers Transfers: Sit to Stand;Stand to Sit Sit to Stand: 4: Min guard;With upper extremity assist;From chair/3-in-1 Stand to Sit: 4: Min guard;With upper extremity assist;To chair/3-in-1 Details for Transfer Assistance: cues for hand placement and LE management Ambulation/Gait Ambulation/Gait Assistance: 4: Min guard;5: Supervision Ambulation Distance (Feet): 140 Feet (and 30') Assistive device: Rolling walker Ambulation/Gait Assistance Details: min cues for position from RW Gait Pattern: Step-to pattern Gait velocity: decr General Gait Details: cues for sequence, posture, position from RW Stairs: Yes Stairs Assistance: 4: Min assist Stairs Assistance Details  (indicate cue type and reason): cues for sequence and foot/crutch placement Stair Management Technique: No rails;Step to pattern;Forwards;With crutches Number of Stairs: 8 (4 twice with spouse assist on 2nd attempt)    Exercises Total Joint Exercises Ankle Circles/Pumps: AROM;Both;Supine;20 reps Quad Sets: AROM;Both;Supine;20 reps Heel Slides: AAROM;Right;Supine;20 reps Straight Leg Raises: AAROM;Right;Supine;AROM;20 reps Goniometric ROM: AAROM -10 = 95   PT Diagnosis:    PT Problem List:   PT Treatment Interventions:     PT Goals (current goals can now be found in the care plan section) Acute Rehab PT Goals Patient Stated Goal: Resume previous lifestyle with decreased pain PT Goal Formulation: With patient Time For Goal Achievement: 10/31/13 Potential to Achieve Goals: Good  Visit Information  Last PT Received On: 10/25/13 Assistance Needed: +1 History of Present Illness: Pt is s/p R TKR    Subjective Data  Patient Stated Goal: Resume previous lifestyle with decreased pain   Cognition  Cognition Arousal/Alertness: Awake/alert Behavior During Therapy: WFL for tasks assessed/performed Overall Cognitive Status: Within Functional Limits for tasks assessed    Balance     End of Session PT - End of Session Equipment Utilized During Treatment: Gait belt;Right knee immobilizer Activity Tolerance: Patient tolerated treatment well Patient left: in chair;with call bell/phone within reach Nurse Communication: Mobility status   GP     Frederick Cruz 10/25/2013, 12:08 PM

## 2014-01-06 DIAGNOSIS — I10 Essential (primary) hypertension: Secondary | ICD-10-CM | POA: Insufficient documentation

## 2015-12-06 HISTORY — PX: OTHER SURGICAL HISTORY: SHX169

## 2018-08-24 ENCOUNTER — Other Ambulatory Visit: Payer: Self-pay | Admitting: Urology

## 2018-08-24 DIAGNOSIS — C61 Malignant neoplasm of prostate: Secondary | ICD-10-CM

## 2018-08-29 ENCOUNTER — Encounter (HOSPITAL_COMMUNITY)
Admission: RE | Admit: 2018-08-29 | Discharge: 2018-08-29 | Disposition: A | Discharge status: Home / Self Care | Payer: Managed Care, Other (non HMO) | Source: Ambulatory Visit | Attending: Urology | Admitting: Urology

## 2018-08-29 DIAGNOSIS — C61 Malignant neoplasm of prostate: Secondary | ICD-10-CM | POA: Insufficient documentation

## 2018-08-29 MED ORDER — TECHNETIUM TC 99M MEDRONATE IV KIT
21.1000 | PACK | Freq: Once | INTRAVENOUS | Status: AC | PRN
  Administered 2018-08-29: 21.1 via INTRAVENOUS

## 2018-09-20 ENCOUNTER — Ambulatory Visit
Admission: RE | Admit: 2018-09-20 | Discharge: 2018-09-20 | Disposition: A | Discharge status: Home / Self Care | Payer: Managed Care, Other (non HMO) | Source: Ambulatory Visit | Attending: Family Medicine | Admitting: Family Medicine

## 2018-09-20 ENCOUNTER — Other Ambulatory Visit: Payer: Self-pay | Admitting: Family Medicine

## 2018-09-20 ENCOUNTER — Other Ambulatory Visit: Payer: Self-pay | Admitting: Urology

## 2018-09-20 DIAGNOSIS — C61 Malignant neoplasm of prostate: Secondary | ICD-10-CM

## 2018-09-20 DIAGNOSIS — Z01818 Encounter for other preprocedural examination: Secondary | ICD-10-CM

## 2018-10-05 ENCOUNTER — Other Ambulatory Visit: Payer: Managed Care, Other (non HMO)

## 2018-12-05 HISTORY — PX: PROSTATECTOMY: SHX69

## 2018-12-20 HISTORY — PX: PROSTATE SURGERY: SHX751

## 2019-01-22 ENCOUNTER — Other Ambulatory Visit: Payer: Self-pay | Admitting: Urology

## 2019-01-22 ENCOUNTER — Encounter: Payer: Self-pay | Admitting: Physical Therapy

## 2019-01-22 ENCOUNTER — Ambulatory Visit: Payer: Managed Care, Other (non HMO) | Attending: Urology | Admitting: Physical Therapy

## 2019-01-22 ENCOUNTER — Other Ambulatory Visit: Payer: Self-pay

## 2019-01-22 DIAGNOSIS — C61 Malignant neoplasm of prostate: Secondary | ICD-10-CM

## 2019-01-22 DIAGNOSIS — M6281 Muscle weakness (generalized): Secondary | ICD-10-CM

## 2019-01-22 DIAGNOSIS — R278 Other lack of coordination: Secondary | ICD-10-CM | POA: Diagnosis present

## 2019-01-22 NOTE — Therapy (Signed)
[facility] [address], [place], [place], [address] Phone: [phone]   Fax:  [phone]  Physical Therapy Evaluation  Patient Details  Name: Frederick Cruz MRN: [id number] Date of Birth: [birth year] Referring Provider (PT): Dr. Alden Benjamin   Encounter Date: 01/22/2019  PT End of Session - 01/22/19 1225    Visit Number  1    Date for PT Re-Evaluation  04/16/19    Authorization Type  [employer]    PT Start Time  1145    PT Stop Time  1228    PT Time Calculation (min)  43 min    Activity Tolerance  Patient tolerated treatment well    Behavior During Therapy  [facility] for tasks assessed/performed       Past Medical History:  Diagnosis Date  . Arthritis   . Cancer [facility])    prostate  . Ringing in ears     Past Surgical History:  Procedure Laterality Date  . BACK SURGERY    . KNEE ARTHROSCOPY     LEFT  . left shoulder surgery  2017  . PROSTATE SURGERY  12/20/2018  . TOTAL KNEE ARTHROPLASTY Right 10/23/2013   Procedure: RIGHT TOTAL KNEE ARTHROPLASTY;  Surgeon: Tobi Bastos, MD;  Location: [facility];  Service: Orthopedics;  Laterality: Right;    There were no vitals filed for this visit.   Subjective Assessment - 01/22/19 1153    Subjective  Patient had robotic radical prostatectomy surgery on 12/20/2018. Patient had the 12/27/2018. Patient wears 1 depends per day, 1 at night    Patient Stated Goals  improve urinary incontinence    Currently in Pain?  No/denies    Multiple Pain Sites  No         [facility] PT Assessment - 01/22/19 0001      Assessment   Medical Diagnosis  R32 Urinary Incontinence    Referring Provider (PT)  Dr. Alden Benjamin    Onset Date/Surgical Date  12/20/18    Prior Therapy  none      Precautions   Precautions  Other (comment)    Precaution Comments  prostate cancer; no lifting over 15# until 02/01/2019      Restrictions   Weight Bearing Restrictions  No      Balance Screen   Has  the patient fallen in the past 6 months  No    Has the patient had a decrease in activity level because of a fear of falling?   No    Is the patient reluctant to leave their home because of a fear of falling?   No      Home Film/video editor residence      Prior Function   Level of Independence  Independent    Vocation  Full time employment    Copy superintendent-standing, walking, driving    Leisure  camping, walking      Cognition   Overall Cognitive Status  Within Functional Limits for tasks assessed      Posture/Postural Control   Posture/Postural Control  Postural limitations    Postural Limitations  Rounded Shoulders;Forward head      ROM / Strength   AROM / PROM / Strength  AROM;PROM;Strength      AROM   Overall AROM Comments  lumbar ROM is full      PROM   Overall PROM Comments  bil. hip ER limited by 25%  Objective measurements completed on examination: See above findings.    Pelvic Floor Special Questions - 01/22/19 0001    Urinary Leakage  Yes    Pad use   1 depends during day adn 1 during the night    Activities that cause leaking  With strong urge;Coughing;Sneezing;Laughing;Lifting;Walking;Bending    Urinary urgency  Yes    Urinary frequency  1 time every 1.5 hours    Fecal incontinence  No    Caffeine beverages  1/2 cup coffee morning    Biofeedback  contract sitting to 10 uv 5 sec; sitting quick contraction 20 uv    Biofeedback sensor type  Surface   rectum              PT Education - 01/22/19 1224    Education Details  pelvic floor contraction in sitting and supine; scar massage    Person(s) Educated  Patient    Methods  Explanation;Demonstration;Verbal cues;Handout    Comprehension  Returned demonstration;Verbalized understanding       PT Short Term Goals - 01/22/19 1210      PT SHORT TERM GOAL #1   Title  independent with initial HEP    Time  4    Period   Weeks    Status  New    Target Date  02/19/19      PT SHORT TERM GOAL #2   Title  understand scar massage to improve tissue mobility    Time  4    Period  Weeks    Status  New    Target Date  02/19/19      PT SHORT TERM GOAL #3   Title  ability to contract pelvic floor for 10 seconds in sitting    Time  4    Period  Weeks    Status  New    Target Date  02/19/19      PT SHORT TERM GOAL #4   Title  understand what bladder irritants are and how they affect the bladder    Time  4    Period  Weeks    Status  New    Target Date  02/19/19      PT SHORT TERM GOAL #5   Title  understand how to perform transitional movements with correct breathing pattern to decrease strain on the pelvic floor    Time  4    Period  Weeks    Status  New    Target Date  02/19/19        PT Long Term Goals - 01/22/19 1326      PT LONG TERM GOAL #1   Title  independent with HEP and understand how to progress himself    Time  12    Period  Weeks    Status  New    Target Date  04/16/19      PT LONG TERM GOAL #2   Title  not have to wear a pad during the night due to ability to hold a pelvic floor contraction 30 seconds in supine    Time  12    Period  Weeks    Status  New    Target Date  04/16/19      PT LONG TERM GOAL #3   Title  urinary leakage during the day may happen 1 time at night after a work day due to ability to contract pelvic floor 20 uv in standing for 10 seconds    Time  12  Period  Weeks    Status  New    Target Date  04/16/19      PT LONG TERM GOAL #4   Title  ability to perform work tasks including walking, standing and driving without urinary leakage due to increased pelvic floor strength    Time  12    Period  Weeks    Status  New    Target Date  04/16/19             Plan - 01/22/19 1313    Clinical Impression Statement  Patient is a 63 year old male with urinary incontinence since he had a radiacal prostectomy surgery on 12/20/2018. Patient is limited  with lifting above 15# till 02/01/2019. Patient reports no pain. Patient wears 1 depends during the day and one during the night. Patient reports he does not feel himself leaking urine at times. Patient pelvic floor resting tone is 2 uv. Patient is able to contract pelvic floor to 10 uv in sitting for 5 seconds but if he contracts to 15 uv he will fatique after 2 seconds. Patient is able to contract to 20 uv in supine for 10 seconds. Patient has decreased mobility of his surgical sites that are healing. Patient has not been doing pelvic floor exercises yet. Patient will urinate every 1 hour. Patient will be returning to work on 02/04/2019. Patients work tasks include standing, walking, and driving.  Patient abdominal strength is 3/5. Bilateral hip flexion 4/5 and right hip extension is 4/5. Patient will benefit from skilled therapy to improve pelvic floor strength to reduce urinary leakage.     History and Personal Factors relevant to plan of care:  prostate cancer; s/p radical prostectomy surgery 12/20/2018; back surgery; total knee arthroplasty; shoulder surgery    Clinical Presentation  Evolving    Clinical Presentation due to:  has to wear depends due to urinary leakage; has not returned to work, does not know if he is leaking urine    Clinical Decision Making  Moderate    Rehab Potential  Excellent    Clinical Impairments Affecting Rehab Potential  no lifting > 15# until 02/01/2019    PT Frequency  1x / week    PT Duration  12 weeks    PT Treatment/Interventions  Biofeedback;Electrical Stimulation;Therapeutic activities;Therapeutic exercise;Neuromuscular re-education;Manual techniques;Patient/family education;Scar mobilization;Dry needling;Energy conservation    PT Next Visit Plan  bladder irritants; abdominal bracing; diaphgramatic breathing, toileting technique; stretching the penis; core strength; holding pelvic floor contraction longer period    Consulted and Agree with Plan of Care  Patient        Patient will benefit from skilled therapeutic intervention in order to improve the following deficits and impairments:  Increased fascial restricitons, Decreased coordination, Decreased scar mobility, Decreased activity tolerance, Decreased endurance, Decreased strength  Visit Diagnosis: Muscle weakness (generalized) - Plan: PT plan of care cert/re-cert  Other lack of coordination - Plan: PT plan of care cert/re-cert  Prostate cancer [facility]) - Plan: PT plan of care cert/re-cert     Problem List Patient Active Problem List   Diagnosis Date Noted  . Acute blood loss anemia 10/25/2013  . Osteoarthritis of right knee 10/23/2013    Earlie Counts, PT 01/22/19 1:31 PM   [facility] [address] [place], [place], [address] Phone: [phone]   Fax:  [phone]  Name: MACON SANDIFORD MRN: [id number] Date of Birth: [birth year]

## 2019-01-22 NOTE — Patient Instructions (Addendum)
  Slow Contraction: Gravity Eliminated (Hook-Lying)    Lie with hips and knees bent. Slowly squeeze pelvic floor for _10__ seconds. Rest for _5__ seconds. Repeat _5__ times. Do __3_ times a day. After the 5 rep perform 5 quick contractions  Copyright  VHI. All rights reserved.  Slow Contraction: Gravity Resisted (Sitting)    Sitting, slowly squeeze pelvic floor for _5__ seconds at 50%. Rest for _5__ seconds. Repeat _5__ times. Do __3_ times a day. End with 5 quick contractions Copyright  VHI. All rights reserved.  [facility] [address], [place], [place] [address] Phone # [phone] Fax [phone]

## 2019-01-26 ENCOUNTER — Other Ambulatory Visit: Payer: Self-pay | Admitting: Cardiology

## 2019-01-26 DIAGNOSIS — I493 Ventricular premature depolarization: Secondary | ICD-10-CM

## 2019-01-30 ENCOUNTER — Encounter: Payer: Self-pay | Admitting: Physical Therapy

## 2019-01-30 ENCOUNTER — Ambulatory Visit: Payer: Managed Care, Other (non HMO) | Admitting: Physical Therapy

## 2019-01-30 DIAGNOSIS — C61 Malignant neoplasm of prostate: Secondary | ICD-10-CM

## 2019-01-30 DIAGNOSIS — M6281 Muscle weakness (generalized): Secondary | ICD-10-CM | POA: Diagnosis not present

## 2019-01-30 DIAGNOSIS — R278 Other lack of coordination: Secondary | ICD-10-CM

## 2019-01-30 MED ORDER — METOPROLOL SUCCINATE ER 25 MG PO TB24: 25.0000 mg | ORAL_TABLET | Freq: Every day | ORAL | 2 refills | Status: DC

## 2019-01-30 NOTE — Therapy (Signed)
[facility] [address] [place], [place], [address] Phone: [phone]   Fax:  [phone]  Physical Therapy Treatment  Patient Details  Name: Frederick Cruz MRN: [id number] Date of Birth: January 09, 1956 Referring Provider (PT): Dr. Alden Benjamin   Encounter Date: 01/30/2019  PT End of Session - 01/30/19 1529    Visit Number  2    Date for PT Re-Evaluation  04/16/19    Authorization Type  [employer]    PT Start Time  0630    PT Stop Time  1525    PT Time Calculation (min)  40 min    Activity Tolerance  Patient tolerated treatment well;No increased pain    Behavior During Therapy  WFL for tasks assessed/performed       Past Medical History:  Diagnosis Date  . Arthritis   . Cancer [facility])    prostate  . Ringing in ears     Past Surgical History:  Procedure Laterality Date  . BACK SURGERY    . KNEE ARTHROSCOPY     LEFT  . left shoulder surgery  2017  . PROSTATE SURGERY  12/20/2018  . TOTAL KNEE ARTHROPLASTY Right 10/23/2013   Procedure: RIGHT TOTAL KNEE ARTHROPLASTY;  Surgeon: Tobi Bastos, MD;  Location: [facility];  Service: Orthopedics;  Laterality: Right;    There were no vitals filed for this visit.  Subjective Assessment - 01/30/19 1447    Subjective  I felt good after last There is a little change in urinary incontinence.     Patient Stated Goals  improve urinary incontinence    Currently in Pain?  No/denies    Multiple Pain Sites  No                       [facility] Adult PT Treatment/Exercise - 01/30/19 0001      Self-Care   Self-Care  Other Self-Care Comments    Other Self-Care Comments   bladder irritants and how they affect the bladder      Exercises   Exercises  Other Exercises    Other Exercises   sitting pelvic floor contraction holding 10 seconds 5x wiht 5 quick flicks; standing pelvic floor contraction hold 5 sec 5 time and end 5 quick flicks      Lumbar Exercises: Supine   Ab Set  10 reps;5 seconds    Clam  20 reps;1 second   with pelvic floor contraction   Bridge  10 reps;5 seconds   with pelvic floor contraction   Other Supine Lumbar Exercises  diaphgramatic breathing with air going into the abdomen      Knee/Hip Exercises: Standing   Forward Lunges  Left;Right;1 set;5 reps;5 seconds   with pelvic floor contraction   Functional Squat  1 set;5 seconds;5 reps   with pelvic floor contraction     Manual Therapy   Manual Therapy  Soft tissue mobilization;Joint mobilization    Joint Mobilization  bilateral lower rib cage mobilization to bring rib cage downward    Soft tissue mobilization  scar massage above the umbilicus;              PT Education - 01/30/19 1528    Education Details  pelvic floor contraction in sitting and standing; core exercises, deep breathing abdominal contraction    Person(s) Educated  Patient    Methods  Explanation;Demonstration;Verbal cues;Handout    Comprehension  Returned demonstration;Verbalized understanding       PT Short  Term Goals - 01/30/19 1532      PT SHORT TERM GOAL #1   Title  independent with initial HEP    Time  4    Period  Weeks    Status  On-going      PT SHORT TERM GOAL #2   Title  understand scar massage to improve tissue mobility    Time  4    Period  Weeks    Status  Achieved      PT SHORT TERM GOAL #3   Title  ability to contract pelvic floor for 10 seconds in sitting    Time  4    Period  Weeks    Status  On-going      PT SHORT TERM GOAL #4   Title  understand what bladder irritants are and how they affect the bladder    Time  4    Period  Weeks    Status  Achieved      PT SHORT TERM GOAL #5   Title  understand how to perform transitional movements with correct breathing pattern to decrease strain on the pelvic floor    Time  4    Period  Weeks    Status  Achieved        PT Long Term Goals - 01/22/19 1326      PT LONG TERM GOAL #1   Title  independent with HEP and  understand how to progress himself    Time  12    Period  Weeks    Status  New    Target Date  04/16/19      PT LONG TERM GOAL #2   Title  not have to wear a pad during the night due to ability to hold a pelvic floor contraction 30 seconds in supine    Time  12    Period  Weeks    Status  New    Target Date  04/16/19      PT LONG TERM GOAL #3   Title  urinary leakage during the day may happen 1 time at night after a work day due to ability to contract pelvic floor 20 uv in standing for 10 seconds    Time  12    Period  Weeks    Status  New    Target Date  04/16/19      PT LONG TERM GOAL #4   Title  ability to perform work tasks including walking, standing and driving without urinary leakage due to increased pelvic floor strength    Time  12    Period  Weeks    Status  New    Target Date  04/16/19            Plan - 01/30/19 1529    Clinical Impression Statement  Patient rib cage is lowered with abdominal contraction due to improve mobility of the lower rib cage. Patient reports the urinary leakage has improved a little. Patient did not fatique after exercise. Patient did not leak urine during therapy session. Patient understands to breath with transitional movements. Patient will  benefit from skilled therapy to improve pelvic floor strength to reduce urinary leakage.     Rehab Potential  Excellent    Clinical Impairments Affecting Rehab Potential  no lifting > 15# until 02/01/2019    PT Frequency  1x / week    PT Duration  12 weeks    PT Treatment/Interventions  Biofeedback;Electrical Stimulation;Therapeutic activities;Therapeutic exercise;Neuromuscular re-education;Manual  techniques;Patient/family education;Scar mobilization;Dry needling;Energy conservation    PT Next Visit Plan   toileting technique; stretching the penis; core strength; holding pelvic floor contraction longer period; advance core exercises    Recommended Other Services  MD signed intial eval    Consulted  and Agree with Plan of Care  Patient       Patient will benefit from skilled therapeutic intervention in order to improve the following deficits and impairments:  Increased fascial restricitons, Decreased coordination, Decreased scar mobility, Decreased activity tolerance, Decreased endurance, Decreased strength  Visit Diagnosis: Muscle weakness (generalized)  Other lack of coordination  Prostate cancer [facility])     Problem List Patient Active Problem List   Diagnosis Date Noted  . Acute blood loss anemia 10/25/2013  . Osteoarthritis of right knee 10/23/2013    Earlie Counts, PT 01/30/19 3:33 PM   [facility] [address] [place], [place], [address] Phone: [phone]   Fax:  [phone]  Name: Frederick Cruz MRN: [id number] Date of Birth: 1956-11-28

## 2019-01-30 NOTE — Patient Instructions (Addendum)
Certain foods and liquids will decrease the pH making the urine more acidic.  Urinary urgency increases when the urine has a low pH.  Most common irritants: alcohol, carbonated beverages and caffinated beverages.  Foods to be aware of : apple juice, apples, ascorbic acid, canteloupes, chili, citrus fruits, coffee, cranberries, grapes, guava, peaches, pepper, pineapple, plums, strawberries, tea, tomatoes, and vinegar.  Drinking plenty of water may help to increase the pH and dilute out any of the effects of specific irritants.  Foods that are NOT irritating to the bladder include: Pears, papayas, sun-brewed teas, watermelons, non-citrus herbal teas, apricots, kava and low-acid instant drinks (Postum)  Abdominal Bracing With Pelvic Floor (Hook-Lying)    With neutral spine, tighten pelvic floor and abdominals. 5 seconds. Repeat _5__ times. Do _1__ times a day.   Copyright  VHI. All rights reserved.  Bracing With Knee Fallout (Hook-Lying)    With neutral spine, tighten pelvic floor and abdominals and hold. Alternating legs, drop knee out to side. Keep opposite hip still. Repeat _20__ times. Do _1__ times a day.   Copyright  VHI. All rights reserved.  Slow Contraction: Gravity Resisted (Sitting)    Sitting, slowly squeeze pelvic floor for _10_ seconds at 50%. Rest for _5__ seconds. Repeat _5__ times. Do __3_ times a day. End with 5 quick contractions Copyright  VHI. All rights reserved  Bracing With Leg March (Hook-Lying)    With neutral spine, tighten pelvic floor and abdominals and hold. Alternating legs, lift foot __12_ inches and return to floor. Repeat _20__ times. Do _1__ times a day.   Copyright  VHI. All rights reserved.  Bracing With Bridging (Hook-Lying)    With neutral spine, tighten pelvic floor and abdominals and hold. Lift bottom. Repeat _10__ times. Hold 5 sec. Do _1__ times a day.   Copyright  VHI. All rights reserved.  Squat    Standing,  squeeze pelvic floor and hold. Squat. Hold 5 sec.  Relax. Repeat __5_ times. Do _1__ times a day.  Copyright  VHI. All rights reserved.   Bracing With Forward Lunge (Standing)    Stand with hands on hips. Find neutral spine. Tighten pelvic floor and abdominals and hold 5 sec.  Alternating legs, step forward and bend knee to lower trunk. Repeat _5__ times each leg.  Do _1__ times a day.  Copyright  VHI. All rights reserved.  Slow Contraction: Gravity Resisted (Standing)    Standing, slowly squeeze pelvic floor for _5__ seconds. Rest for _5__ seconds. Repeat _5__ times. Do _3__ times a day. End with 5 quick flicks.  Copyright  VHI. All rights reserved.  [facility] [address], [place],  [address] Phone # [phone] Fax [phone]

## 2019-01-30 NOTE — Addendum Note (Signed)
Addended by: Gwinda Maine on: 01/30/2019 08:14 AM   Modules accepted: Orders

## 2019-01-31 ENCOUNTER — Ambulatory Visit
Admission: RE | Admit: 2019-01-31 | Discharge: 2019-01-31 | Disposition: A | Discharge status: Home / Self Care | Payer: Managed Care, Other (non HMO) | Source: Ambulatory Visit | Attending: Urology | Admitting: Urology

## 2019-01-31 DIAGNOSIS — C61 Malignant neoplasm of prostate: Secondary | ICD-10-CM

## 2019-02-06 ENCOUNTER — Encounter: Payer: Self-pay | Admitting: Physical Therapy

## 2019-02-06 ENCOUNTER — Ambulatory Visit: Payer: Managed Care, Other (non HMO) | Attending: Urology | Admitting: Physical Therapy

## 2019-02-06 DIAGNOSIS — R278 Other lack of coordination: Secondary | ICD-10-CM

## 2019-02-06 DIAGNOSIS — M6281 Muscle weakness (generalized): Secondary | ICD-10-CM | POA: Diagnosis present

## 2019-02-06 DIAGNOSIS — C61 Malignant neoplasm of prostate: Secondary | ICD-10-CM | POA: Diagnosis present

## 2019-02-06 NOTE — Therapy (Signed)
[facility] [address] [place], [place], [address] Phone: [phone]   Fax:  [phone]  Physical Therapy Treatment  Patient Details  Name: Frederick Cruz MRN: [id number] Date of Birth: June 15, 1956 Referring Provider (PT): Dr. Alden Benjamin   Encounter Date: 02/06/2019  PT End of Session - 02/06/19 1147    Visit Number  3    Date for PT Re-Evaluation  04/16/19    Authorization Type  [employer]    PT Start Time  1145    PT Stop Time  1225    PT Time Calculation (min)  40 min    Activity Tolerance  Patient tolerated treatment well;No increased pain    Behavior During Therapy  WFL for tasks assessed/performed       Past Medical History:  Diagnosis Date  . Arthritis   . Cancer [facility])    prostate  . Ringing in ears     Past Surgical History:  Procedure Laterality Date  . BACK SURGERY    . KNEE ARTHROSCOPY     LEFT  . left shoulder surgery  2017  . PROSTATE SURGERY  12/20/2018  . TOTAL KNEE ARTHROPLASTY Right 10/23/2013   Procedure: RIGHT TOTAL KNEE ARTHROPLASTY;  Surgeon: Tobi Bastos, MD;  Location: [facility];  Service: Orthopedics;  Laterality: Right;    There were no vitals filed for this visit.  Subjective Assessment - 02/06/19 1148    Subjective  I am able to stay away from the commode a longer period of time. I am able to make every 1.5 to 2 hours. Patient reports he still has leakage. I am walking, getting in and out of truck. I leak some urine at night. I can feel the pelvic floor muscles contract. I leak more toward the end of the day.     Patient Stated Goals  improve urinary incontinence    Currently in Pain?  No/denies    Multiple Pain Sites  No                       [facility] Adult PT Treatment/Exercise - 02/06/19 0001      Neuro Re-ed    Neuro Re-ed Details   sitting pelvic floor contraction with holding 15 sec relax, standing holding 10 sec and relax, stand and bring one leg on  table and down with pelvic floor contraction      Lumbar Exercises: Supine   Clam  10 reps;1 second   each leg with red band   Clam Limitations  5 times each leg with no band    Bent Knee Raise  10 reps;1 second   no band then 10 times with red band   Bridge with clamshell  10 reps;1 second   2 sets     Manual Therapy   Manual Therapy  Soft tissue mobilization    Soft tissue mobilization  scar massage above the umbilicus;              PT Education - 02/06/19 1228    Education Details  pelvic floor exercises advanced    Person(s) Educated  Patient    Methods  Explanation;Demonstration;Verbal cues;Handout    Comprehension  Returned demonstration;Verbalized understanding       PT Short Term Goals - 02/06/19 1231      PT SHORT TERM GOAL #1   Title  independent with initial HEP    Time  4    Period  Weeks  Status  Achieved      PT SHORT TERM GOAL #3   Title  ability to contract pelvic floor for 10 seconds in sitting    Time  4    Period  Weeks    Status  Achieved        PT Long Term Goals - 01/22/19 1326      PT LONG TERM GOAL #1   Title  independent with HEP and understand how to progress himself    Time  12    Period  Weeks    Status  New    Target Date  04/16/19      PT LONG TERM GOAL #2   Title  not have to wear a pad during the night due to ability to hold a pelvic floor contraction 30 seconds in supine    Time  12    Period  Weeks    Status  New    Target Date  04/16/19      PT LONG TERM GOAL #3   Title  urinary leakage during the day may happen 1 time at night after a work day due to ability to contract pelvic floor 20 uv in standing for 10 seconds    Time  12    Period  Weeks    Status  New    Target Date  04/16/19      PT LONG TERM GOAL #4   Title  ability to perform work tasks including walking, standing and driving without urinary leakage due to increased pelvic floor strength    Time  12    Period  Weeks    Status  New    Target  Date  04/16/19            Plan - 02/06/19 1147    Clinical Impression Statement  Patient is able to feel himself contract the pelvic floor. Patient is able to wait 1.5 to 2 hours to urinate. Patient has more leakage at end of the day. Patient has small amount of leakage at night. Patient has returned to work. Patient is independent with his current HEP. Patient will benefit from skilled therapy to improve pelvic floor strength to reduce urinary leakage.     Rehab Potential  Excellent    Clinical Impairments Affecting Rehab Potential  no lifting > 15# until 02/01/2019    PT Frequency  1x / week    PT Duration  12 weeks    PT Treatment/Interventions  Biofeedback;Electrical Stimulation;Therapeutic activities;Therapeutic exercise;Neuromuscular re-education;Manual techniques;Patient/family education;Scar mobilization;Dry needling;Energy conservation    PT Next Visit Plan  advance pelvic floor exercise in sitting and standing    Consulted and Agree with Plan of Care  Patient       Patient will benefit from skilled therapeutic intervention in order to improve the following deficits and impairments:  Increased fascial restricitons, Decreased coordination, Decreased scar mobility, Decreased activity tolerance, Decreased endurance, Decreased strength  Visit Diagnosis: Muscle weakness (generalized)  Other lack of coordination  Prostate cancer [facility])     Problem List Patient Active Problem List   Diagnosis Date Noted  . Acute blood loss anemia 10/25/2013  . Osteoarthritis of right knee 10/23/2013    Earlie Counts, PT 02/06/19 12:32 PM   [facility] [address] [place], [place], [address] Phone: [phone]   Fax:  [phone]  Name: Frederick Cruz MRN: [id number] Date of Birth: 1956/02/02

## 2019-02-06 NOTE — Patient Instructions (Addendum)
Bracing With Knee Fallout (Hook-Lying)    With neutral spine, tighten pelvic floor and abdominals and hold. Alternating legs, drop knee out to side. Keep opposite hip still. Repeat _20__ times. Do _1__ times a day. Have red band around knees   Copyright  VHI. All rights reserved.  Bracing With Leg March (Hook-Lying)    With neutral spine, tighten pelvic floor and abdominals and hold. Alternating legs, lift foot __12_ inches and return to floor. Repeat _20__ times. Do _1__ times a day. Put yellow band around knees  Copyright  VHI. All rights reserved.     Bridging With Pelvic Floor (Hook-Lying)   1 Lie with hips and knees bent. Tighten pelvic floor while lifting bottom. Hold for _1__ seconds. Relax. Repeat _15__ times. Do __1_ times a day. Advanced: Hands across chest. Hands behind head. Keeps hips up and bring knees in and out 15 times   Copyright  VHI. All rights reserved.  Slow Contraction: Gravity Resisted (Sitting)    Sitting, slowly squeeze pelvic floor for _15__ seconds. Rest for _5__ seconds. Repeat _5__ times. Do __3_ times a day. End with 5 quick flicks Copyright  VHI. All rights reserved.   Slow Contraction: Gravity Resisted (Standing)    Standing, slowly squeeze pelvic floor for _10__ seconds. Rest for _5__ seconds. Repeat _5__ times. Do _3__ times a day. End with 3 quick flicks.  Copyright  VHI. All rights reserved.  Step Down (Sitting)    Sitting, tighten pelvic floor and Hold for 2___ seconds. Release contraction by half and hold for _2__ seconds. Relax. Repeat __5_ times. Do _2__ times a day.  Copyright  VHI. All rights reserved.  Standing contract the pelvic floor Bring the one leg on the step then down while contracting the pelvic floor  10 times each leg 1 time per day.  bradycardia

## 2019-02-13 ENCOUNTER — Other Ambulatory Visit: Payer: Self-pay

## 2019-02-13 ENCOUNTER — Encounter: Payer: Self-pay | Admitting: Physical Therapy

## 2019-02-13 ENCOUNTER — Ambulatory Visit: Payer: Managed Care, Other (non HMO) | Admitting: Physical Therapy

## 2019-02-13 DIAGNOSIS — C61 Malignant neoplasm of prostate: Secondary | ICD-10-CM

## 2019-02-13 DIAGNOSIS — M6281 Muscle weakness (generalized): Secondary | ICD-10-CM

## 2019-02-13 DIAGNOSIS — R278 Other lack of coordination: Secondary | ICD-10-CM

## 2019-02-13 NOTE — Patient Instructions (Signed)
Access Code: [email or web address]  URL: [email or web address]  Date: 02/13/2019  Prepared by: Earlie Counts   Exercises  Standing Diagonal Lift with Anchored Resistance - 10 reps - 1 sets - 1x daily - 7x weekly  Shoulder Flexion with Anterior Anchored Resistance - 10 reps - 1 sets - 1x daily - 7x weekly  Seated Diagonal Chops with Resistance - 10 reps - 1 sets - 1x daily - 7x weekly  Full Lunge with Pelvic Floor Contractions - 5 reps - 1 sets - 1x daily - 7x weekly  Seated Pelvic Floor Contraction - 10 reps - 1 sets - 10 sec hold - 3x daily - 7x weekly  [facility] [address] [place], [place] [address] Phone # [phone] Fax [phone]

## 2019-02-13 NOTE — Therapy (Addendum)
[facility] [address], [place], [place], [address] Phone: [phone]   Fax:  [phone]  Physical Therapy Treatment  Patient Details  Name: Frederick Cruz MRN: [id number] Date of Birth: [birth year] Referring Provider (PT): Dr. Alden Benjamin   Encounter Date: 02/13/2019  PT End of Session - 02/13/19 1151    Visit Number  4    Date for PT Re-Evaluation  04/16/19    Authorization Type  [employer]    PT Start Time  8315    PT Stop Time  1225    PT Time Calculation (min)  40 min    Activity Tolerance  Patient tolerated treatment well;No increased pain    Behavior During Therapy  WFL for tasks assessed/performed       Past Medical History:  Diagnosis Date  . Arthritis   . Cancer [facility])    prostate  . Ringing in ears     Past Surgical History:  Procedure Laterality Date  . BACK SURGERY    . KNEE ARTHROSCOPY     LEFT  . left shoulder surgery  2017  . PROSTATE SURGERY  12/20/2018  . TOTAL KNEE ARTHROPLASTY Right 10/23/2013   Procedure: RIGHT TOTAL KNEE ARTHROPLASTY;  Surgeon: Tobi Bastos, MD;  Location: [facility];  Service: Orthopedics;  Laterality: Right;    There were no vitals filed for this visit.  Subjective Assessment - 02/13/19 1148    Subjective  I am able to wait every 2 hours to urinate. I am wearing a 1 pad per day. The pad is 60% better and has some days with minimal to no wetness. 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 Minimal trouble with walking and leaking. I can feel the pelvic floor muscles contract well. End of the day is the worse leakage.     Patient Stated Goals  improve urinary incontinence    Currently in Pain?  No/denies    Multiple Pain Sites  No                               PT Education - 02/13/19 1220    Education Details  Access Code: [email or web address]     Person(s) Educated  Patient    Methods  Explanation;Demonstration;Verbal cues;Handout    Comprehension  Returned demonstration;Verbalized understanding       PT Short Term Goals - 02/06/19 1231      PT SHORT TERM GOAL #1   Title  independent with initial HEP    Time  4    Period  Weeks    Status  Achieved      PT SHORT TERM GOAL #3   Title  ability to contract pelvic floor for 10 seconds in sitting    Time  4    Period  Weeks    Status  Achieved        PT Long Term Goals - 02/13/19 1225      PT LONG TERM GOAL #1   Title  independent with HEP and understand how to progress himself    Time  12    Period  Weeks    Status  On-going      PT LONG TERM GOAL #2   Title  not have to wear a pad during the night due to ability to hold a pelvic floor contraction 30 seconds in supine    Time  12    Period  Weeks    Status  On-going      PT LONG TERM GOAL #3   Title  urinary leakage during the day may happen 1 time at night after a work day due to ability to contract pelvic floor 20 uv in standing for 10 seconds    Time  12    Period  Weeks    Status  On-going      PT LONG TERM GOAL #4   Title  ability to perform work tasks including walking, standing and driving without urinary leakage due to increased pelvic floor strength    Time  12    Period  Weeks    Status  On-going            Plan - 02/13/19 1221    Clinical Impression Statement  Patient reports urinary leakage with pulling, getting in and out of truck, and end of the day. Patient is wearing one pad per day that is 605 dryer. Patient is consistent with his HEP. Patient has learned HEP that is advanced in standing and eccentric contraction of the pelvic floor. Patient is able to wait 2 hours prior to having to go to the bathroom. Patient will benefit from skilled therapy to improve pelvic floor strength to reduce urinary leakage.     Rehab Potential  Excellent    Clinical Impairments Affecting Rehab Potential  no lifting > 15# until 02/01/2019    PT Treatment/Interventions  Biofeedback;Electrical  Stimulation;Therapeutic activities;Therapeutic exercise;Neuromuscular re-education;Manual techniques;Patient/family education;Scar mobilization;Dry needling;Energy conservation    PT Next Visit Plan  see how MD appointment went; increase pelvic floor contraction to 15 sec in sitting and 5 seconds in standing; work on lifting    PT Home Exercise Plan  Access Code: [email or web address]     Consulted and Agree with Plan of Care  Patient       Patient will benefit from skilled therapeutic intervention in order to improve the following deficits and impairments:  Increased fascial restricitons, Decreased coordination, Decreased scar mobility, Decreased activity tolerance, Decreased endurance, Decreased strength  Visit Diagnosis: Muscle weakness (generalized)  Other lack of coordination  Prostate cancer [facility])     Problem List Patient Active Problem List   Diagnosis Date Noted  . Acute blood loss anemia 10/25/2013  . Osteoarthritis of right knee 10/23/2013    Earlie Counts, PT 02/13/19 12:26 PM   [facility] [address], [place], [place], [address] Phone: [phone]   Fax:  [phone]  Name: Frederick Cruz MRN: [id number] Date of Birth: [birth year]  PHYSICAL THERAPY DISCHARGE SUMMARY  Visits from Start of Care: 4  Current functional level related to goals / functional outcomes: Called patient on 04/11/2019. He reported he did not need anymore therapy. He reported he is doing well.    Remaining deficits: See above.    Education / Equipment: HEP  Plan: Patient agrees to discharge.  Patient goals were partially met. Patient is being discharged due to being pleased with the current functional level. Thank you for the referral. Earlie Counts, PT 04/11/19 4:58 PM   ?????

## 2019-02-20 ENCOUNTER — Encounter: Payer: Managed Care, Other (non HMO) | Admitting: Physical Therapy

## 2019-03-18 ENCOUNTER — Encounter: Payer: Managed Care, Other (non HMO) | Admitting: Physical Therapy

## 2019-03-20 ENCOUNTER — Other Ambulatory Visit: Payer: Self-pay | Admitting: Physician Assistant

## 2019-03-20 DIAGNOSIS — I898 Other specified noninfective disorders of lymphatic vessels and lymph nodes: Secondary | ICD-10-CM

## 2019-05-08 ENCOUNTER — Other Ambulatory Visit: Payer: Self-pay | Admitting: Cardiology

## 2019-05-08 DIAGNOSIS — I493 Ventricular premature depolarization: Secondary | ICD-10-CM

## 2019-06-04 ENCOUNTER — Other Ambulatory Visit: Payer: Managed Care, Other (non HMO)

## 2019-06-13 ENCOUNTER — Ambulatory Visit
Admission: RE | Admit: 2019-06-13 | Discharge: 2019-06-13 | Disposition: A | Discharge status: Home / Self Care | Payer: Managed Care, Other (non HMO) | Source: Ambulatory Visit | Attending: Physician Assistant | Admitting: Physician Assistant

## 2019-06-13 DIAGNOSIS — T8189XA Other complications of procedures, not elsewhere classified, initial encounter: Secondary | ICD-10-CM

## 2019-06-13 DIAGNOSIS — I898 Other specified noninfective disorders of lymphatic vessels and lymph nodes: Secondary | ICD-10-CM

## 2019-06-20 ENCOUNTER — Telehealth: Payer: Self-pay | Admitting: *Deleted

## 2019-06-20 NOTE — Telephone Encounter (Signed)
"  [place] [phone]) calling for an oncologist for my husbands prostate cancer.  The surgeon in [place] sent e-mail to me to send to the office.  May be the referral information.  He also had referral sent to [facility] for things to be done and needs prostate oncologist to receive and review results.  I checked [employer] and no prostate provider was found."  Informed Lovey Newcomer individuas are unable to self refer or request appointments with [facility].  Provided new patient referral fax number [phone] with instructions for referral to come directly from providers office.  Records, lab reports, office notes, scans for referral will also be needed.   Referral may be sent through [facility] if surgeon uses [employer] EMR computer program.  Currently denies further questions or needs.

## 2019-06-26 ENCOUNTER — Other Ambulatory Visit: Payer: Self-pay | Admitting: Acute Care

## 2019-06-26 ENCOUNTER — Encounter: Payer: Self-pay | Admitting: *Deleted

## 2019-06-26 ENCOUNTER — Other Ambulatory Visit (HOSPITAL_COMMUNITY): Payer: Self-pay | Admitting: Acute Care

## 2019-06-26 DIAGNOSIS — Z8546 Personal history of malignant neoplasm of prostate: Secondary | ICD-10-CM

## 2019-06-26 NOTE — Progress Notes (Signed)
Reached out to [employer] to introduce myself as the office RN Navigator and explain our new patient process. Spoke with patient's wife, Lovey Newcomer. Reviewed the reason for their referral and scheduled their new patient appointment along with labs. Provided address and directions to the office including call back phone number. Reviewed with patient any concerns they may have or any possible barriers to attending their appointment.   Informed patient about my role as a navigator and that I will meet with them prior to their New Patient appointment and more fully discuss what services I can provide. At this time patient has no further questions or needs.

## 2019-07-02 ENCOUNTER — Other Ambulatory Visit: Payer: Self-pay | Admitting: Cardiology

## 2019-07-02 ENCOUNTER — Encounter: Payer: Self-pay | Admitting: Cardiology

## 2019-07-02 DIAGNOSIS — I493 Ventricular premature depolarization: Secondary | ICD-10-CM

## 2019-07-03 ENCOUNTER — Encounter: Payer: Self-pay | Admitting: Cardiology

## 2019-07-03 ENCOUNTER — Other Ambulatory Visit: Payer: Self-pay

## 2019-07-03 ENCOUNTER — Ambulatory Visit (INDEPENDENT_AMBULATORY_CARE_PROVIDER_SITE_OTHER): Payer: Managed Care, Other (non HMO) | Admitting: Cardiology

## 2019-07-03 VITALS — BP 122/81 | HR 55 | Ht 69.0 in | Wt 210.0 lb

## 2019-07-03 DIAGNOSIS — I493 Ventricular premature depolarization: Secondary | ICD-10-CM

## 2019-07-03 DIAGNOSIS — E782 Mixed hyperlipidemia: Secondary | ICD-10-CM

## 2019-07-03 NOTE — Progress Notes (Signed)
Telephone visit note  Subjective:   Frederick Cruz, male    DOB: 10/17/1956, 63 y.o.   MRN: [id number]   I connected with the patient on 07/03/19 by a telephone call and verified that I am speaking with the correct person using two identifiers.     I offered the patient a video enabled application for a virtual visit. Unfortunately, this could not be accomplished due to technical difficulties/lack of video enabled phone/computer. I discussed the limitations of evaluation and management by telemedicine and the availability of in person appointments. The patient expressed understanding and agreed to proceed.   This visit type was conducted due to national recommendations for restrictions regarding the COVID-19 Pandemic (e.g. social distancing).  This format is felt to be most appropriate for this patient at this time.  All issues noted in this document were discussed and addressed.  No physical exam was performed (except for noted visual exam findings with Tele health visits).  The patient has consented to conduct a Tele health visit and understands insurance will be billed.   Chief complaint:  Palpitations   HPI  63 y/o Caucasian male with hyperlipidemia, hyperlipidemia, frequent PVC's, history of basal cell carcinoma, former tobacco use, prostate cancer, s/p laporoscopic prostectomy 09/2018.  He has only occasional palpitations that do not affect his day to day activity. He denies chest pain, shortness of breath, palpitations, leg edema, orthopnea, PND, TIA/syncope.   He has been taking lipitor 10 mg daily. He recently underwent lipid panel through his PCP but does not have the results.   Past Medical History:  Diagnosis Date  . Arthritis   . Cancer [facility])    prostate  . Ringing in ears      Past Surgical History:  Procedure Laterality Date  . BACK SURGERY    . KNEE ARTHROSCOPY     LEFT  . left shoulder surgery  2017  . PROSTATE SURGERY  12/20/2018  . TOTAL KNEE  ARTHROPLASTY Right 10/23/2013   Procedure: RIGHT TOTAL KNEE ARTHROPLASTY;  Surgeon: Tobi Bastos, MD;  Location: [facility];  Service: Orthopedics;  Laterality: Right;     Social History   Socioeconomic History  . Marital status: Married    Spouse name: Not on file  . Number of children: 1  . Years of education: Not on file  . Highest education level: Not on file  Occupational History  . Not on file  Social Needs  . Financial resource strain: Not on file  . Food insecurity    Worry: Not on file    Inability: Not on file  . Transportation needs    Medical: Not on file    Non-medical: Not on file  Tobacco Use  . Smoking status: Former Smoker    Packs/day: 1.50    Years: 20.00    Pack years: 30.00    Types: Cigarettes    Quit date: 10/18/1984    Years since quitting: 34.7  . Smokeless tobacco: Never Used  Substance and Sexual Activity  . Alcohol use: Yes    Comment: OCCASIONAL  . Drug use: No  . Sexual activity: Not on file  Lifestyle  . Physical activity    Days per week: Not on file    Minutes per session: Not on file  . Stress: Not on file  Relationships  . Social Herbalist on phone: Not on file    Gets together: Not on file    Attends religious service: Not  on file    Active member of club or organization: Not on file    Attends meetings of clubs or organizations: Not on file    Relationship status: Not on file  . Intimate partner violence    Fear of current or ex partner: Not on file    Emotionally abused: Not on file    Physically abused: Not on file    Forced sexual activity: Not on file  Other Topics Concern  . Not on file  Social History Narrative  . Not on file     Family History  Problem Relation Age of Onset  . Heart Problems Brother   . Heart Problems Brother      Current Outpatient Medications on File Prior to Visit  Medication Sig Dispense Refill  . aspirin EC 81 MG tablet Take 81 mg by mouth daily.    Marland Kitchen atorvastatin  (LIPITOR) 10 MG tablet Take 10 mg by mouth daily.    . sildenafil (VIAGRA) 100 MG tablet Take 100 mg by mouth daily as needed for erectile dysfunction.     No current facility-administered medications on file prior to visit.     Cardiovascular studies:   Echocardiogram 10/01/2018: Left ventricle cavity is normal in size. Mild concentric hypertrophy of the left ventricle. Normal global wall motion. Normal diastolic filling pattern. Calculated EF 63%. Mild (Grade I) aortic regurgitation. Mild tricuspid regurgitation. Estimated pulmonary artery systolic pressure 73-41 mmHg. IVC is dilated with a respiratory response of <50%. This may suggest elevated right atrial pressure.  Exercise treadmill stress test 09/28/2018:  Indication: pre-op The patient exercised on Bruce protocol for 10:56 min. Patient achieved  13.34 METS and reached HR  164 bpm, which is 104% of maximum age-predicted HR.  Stress test terminated due to fatigue.   Exercise capacity was normal. HR Response to Exercise: Appropriate. BP Response to Exercise: Normal resting BP- appropriate response. Chest Pain: none. Arrhythmias:  Frequent unifocal PVC's during exercise. Resting EKG demonstrates Normal sinus rhythm. ST Changes: With peak exercise there was no ST-T changes of ischemia. Frequent  unifocal PVC's seen.  Overall Impression: Negative for ischemia. Frequent PVC's seen. Recommendations: Recommend echocardiogram.  Recent labs: Labs 06/12/2018: H/H 15/42.  MCV 91.  Platelets 203 Cholesterol 199,  triglycerides 72, HDL 53, LDL 150 PSA 30 elevated  ROS       Vitals:   07/03/19 0816  BP: 122/81  Pulse: (!) 55   (Measured by the patient using a home BP monitor)  Physical Exam: Not performed, as this is a telephone visit        Assessment & Recommendations:  63 y/o Caucasian male with hyperlipidemia, hyperlipidemia, frequent PVC's, history of basal cell carcinoma, former tobacco use, prostate cancer, s/p  laporoscopic prostectomy 09/2018.  Frequent PVC's: Clinically imporoved with metoprolol. Recommend prn follow up if symptoms get worse.  Hyperlipidemia: On lipitor 10 mg daily. If LDL >100, he may increase it to 20 mg daily & follow up with PCP Dr. Daron Offer.  I will see him as needed.   Nigel Mormon, MD [facility] Pager: [phone] Office: [phone] If no answer Cell [phone]

## 2019-07-04 ENCOUNTER — Ambulatory Visit: Payer: Self-pay | Admitting: Cardiology

## 2019-07-08 ENCOUNTER — Ambulatory Visit (HOSPITAL_COMMUNITY)
Admission: RE | Admit: 2019-07-08 | Discharge: 2019-07-08 | Disposition: A | Discharge status: Home / Self Care | Payer: Managed Care, Other (non HMO) | Source: Ambulatory Visit | Attending: Acute Care | Admitting: Acute Care

## 2019-07-08 ENCOUNTER — Other Ambulatory Visit (HOSPITAL_COMMUNITY): Payer: Self-pay | Admitting: Acute Care

## 2019-07-08 ENCOUNTER — Other Ambulatory Visit: Payer: Self-pay

## 2019-07-08 DIAGNOSIS — Z8546 Personal history of malignant neoplasm of prostate: Secondary | ICD-10-CM | POA: Diagnosis not present

## 2019-07-08 MED ORDER — GADOBUTROL 1 MMOL/ML IV SOLN
10.0000 mL | Freq: Once | INTRAVENOUS | Status: AC | PRN
  Administered 2019-07-08: 19:00:00 10 mL via INTRAVENOUS

## 2019-07-11 ENCOUNTER — Other Ambulatory Visit (HOSPITAL_COMMUNITY): Payer: Self-pay | Admitting: Acute Care

## 2019-07-11 DIAGNOSIS — C61 Malignant neoplasm of prostate: Secondary | ICD-10-CM

## 2019-07-11 DIAGNOSIS — R972 Elevated prostate specific antigen [PSA]: Secondary | ICD-10-CM

## 2019-07-16 ENCOUNTER — Ambulatory Visit (HOSPITAL_COMMUNITY)
Admission: RE | Admit: 2019-07-16 | Discharge: 2019-07-16 | Disposition: A | Discharge status: Home / Self Care | Payer: Managed Care, Other (non HMO) | Source: Ambulatory Visit | Attending: Acute Care | Admitting: Acute Care

## 2019-07-16 ENCOUNTER — Other Ambulatory Visit: Payer: Self-pay

## 2019-07-16 DIAGNOSIS — R972 Elevated prostate specific antigen [PSA]: Secondary | ICD-10-CM | POA: Diagnosis present

## 2019-07-16 DIAGNOSIS — C61 Malignant neoplasm of prostate: Secondary | ICD-10-CM

## 2019-07-16 IMAGING — PT NUCLEAR MEDICINE  NOPR SKULL BASE TO THIGH
2 series · 6 of 6 positions shown · non-contrast
Comparison: MRI 07/08/2019, CT abdomen pelvis 08/29/2018

CLINICAL DATA: Prostate carcinoma with biochemical recurrence.
Rising PSA. Radical prostatectomy and pelvic lymphadenectomy on
December 20, 2018. PSA equal

EXAM:
NUCLEAR MEDICINE PET SKULL BASE TO THIGH
TECHNIQUE: 8.4 mCi F-18 Fluciclovine was injected intravenously. Full-ring PET
imaging was performed from the skull base to thigh after the
radiotracer. CT data was obtained and used for attenuation
correction and anatomic localization.

[Series 1054: results mm oncology reading · 1.3mm · 0.82mm/px · 2 of 2 slices shown (1 of 2)]
[im 1/2]
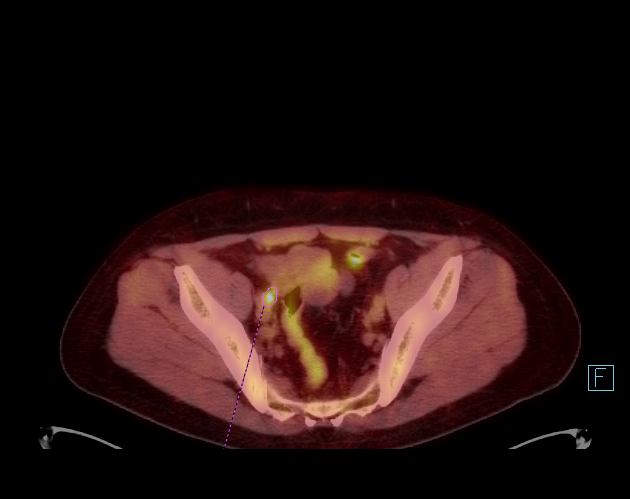
[im 2/2]
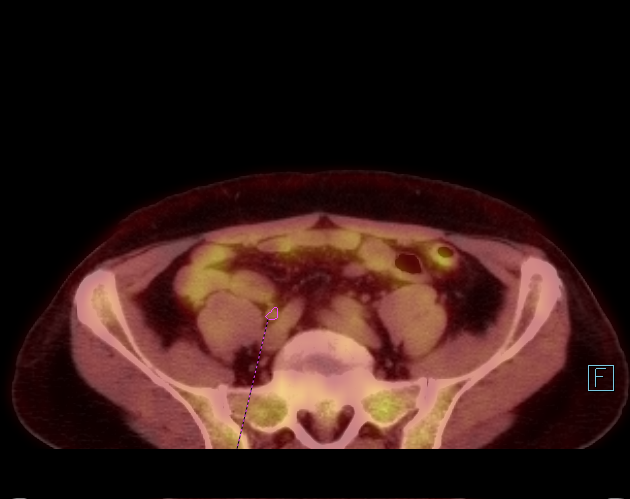

[Series 1225: results mm oncology reading · 5.0mm · 1.10mm/px · 4 of 4 slices shown (2 of 2)]
[im 1/4]
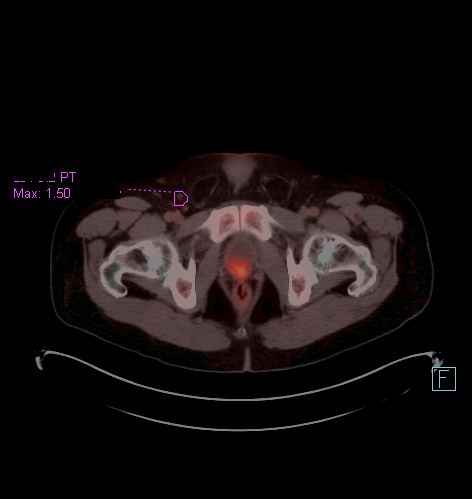
[im 2/4]
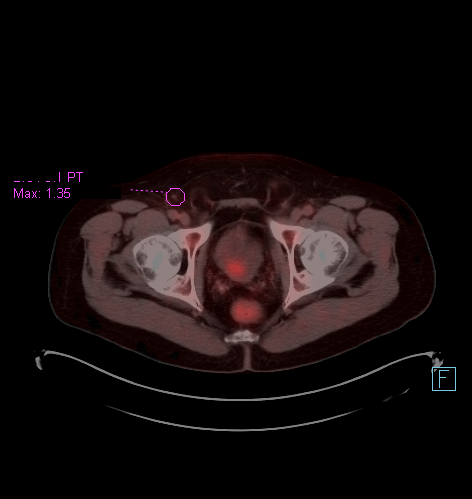
[im 3/4]
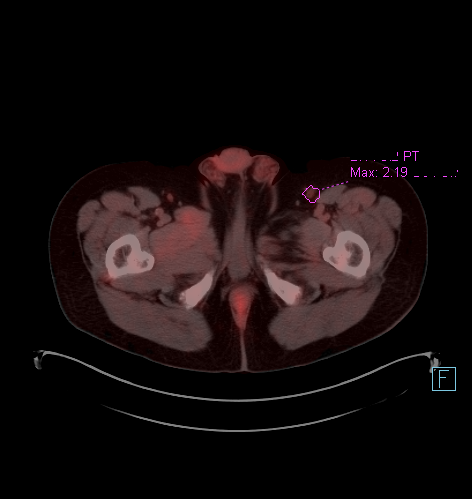
[im 4/4]
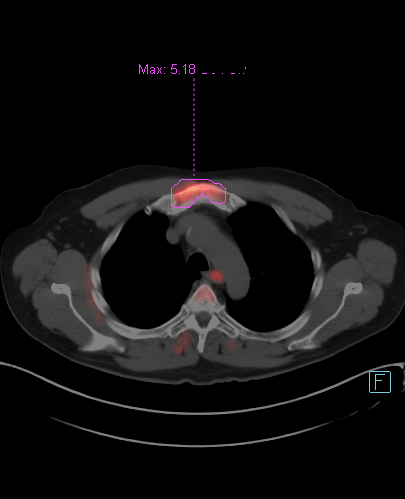

[6 of 6 positions shown; findings below may reference images not displayed]

FINDINGS: NECK

No radiotracer activity in neck lymph nodes.

Incidental CT finding: None

CHEST

No radiotracer accumulation within mediastinal or hilar lymph nodes.
No suspicious pulmonary nodules on the CT scan.

Incidental CT finding: 7 mm nodule in the LEFT lower lobe is present
on comparison CT is similar size to 1 year prior

ABDOMEN/PELVIS

Prostate: No focal activity in the prostate bed.

Lymph nodes: Intense radiotracer activity associated a small RIGHT
external iliac lymph node measuring only 5 mm short axis (image
174/4) with intense radiotracer activity for size with SUV max equal
6.1.

More superiorly, small RIGHT common iliac node measures 6 mm (image
162/4) without radiotracer activity.

No additional pelvic lymph nodes are identified.

Liver: No evidence of liver metastasis

Incidental CT finding: Bladder position low in the pelvic floor.

SKELETON

No focal  activity to suggest skeletal metastasis.
IMPRESSION: 1. Intense radiotracer activity associated with small RIGHT external
iliac lymph node is most consistent with residual lymph node
prostate carcinoma.
2. No evidence local recurrence in the prostate bed.
3. No evidence of distant metastatic disease or skeletal metastasis.

## 2019-07-18 ENCOUNTER — Inpatient Hospital Stay (HOSPITAL_BASED_OUTPATIENT_CLINIC_OR_DEPARTMENT_OTHER): Payer: Managed Care, Other (non HMO) | Admitting: Hematology & Oncology

## 2019-07-18 ENCOUNTER — Inpatient Hospital Stay: Payer: Managed Care, Other (non HMO) | Attending: Hematology & Oncology

## 2019-07-18 ENCOUNTER — Encounter: Payer: Self-pay | Admitting: Hematology & Oncology

## 2019-07-18 ENCOUNTER — Other Ambulatory Visit: Payer: Self-pay

## 2019-07-18 ENCOUNTER — Encounter: Payer: Self-pay | Admitting: *Deleted

## 2019-07-18 VITALS — BP 126/80 | HR 53 | Temp 98.8°F | Resp 17 | Wt 207.0 lb

## 2019-07-18 DIAGNOSIS — Z87891 Personal history of nicotine dependence: Secondary | ICD-10-CM

## 2019-07-18 DIAGNOSIS — Z7189 Other specified counseling: Secondary | ICD-10-CM | POA: Diagnosis not present

## 2019-07-18 DIAGNOSIS — Z5111 Encounter for antineoplastic chemotherapy: Secondary | ICD-10-CM | POA: Diagnosis not present

## 2019-07-18 DIAGNOSIS — Z191 Hormone sensitive malignancy status: Secondary | ICD-10-CM

## 2019-07-18 DIAGNOSIS — C61 Malignant neoplasm of prostate: Secondary | ICD-10-CM

## 2019-07-18 DIAGNOSIS — C775 Secondary and unspecified malignant neoplasm of intrapelvic lymph nodes: Secondary | ICD-10-CM | POA: Insufficient documentation

## 2019-07-18 HISTORY — DX: Other specified counseling: Z71.89

## 2019-07-18 HISTORY — DX: Malignant neoplasm of prostate: C61

## 2019-07-18 LAB — CMP (CANCER CENTER ONLY)
ALT: 24 U/L (ref 0–44)
AST: 25 U/L (ref 15–41)
Albumin: 4.4 g/dL (ref 3.5–5.0)
Alkaline Phosphatase: 45 U/L (ref 38–126)
Anion gap: 8 (ref 5–15)
BUN: 23 mg/dL (ref 8–23)
CO2: 28 mmol/L (ref 22–32)
Calcium: 9 mg/dL (ref 8.9–10.3)
Chloride: 106 mmol/L (ref 98–111)
Creatinine: 0.86 mg/dL (ref 0.61–1.24)
GFR, Est AFR Am: >60 mL/min (ref >60)
GFR, Estimated: >60 mL/min (ref >60)
Glucose, Bld: 88 mg/dL (ref 70–99)
Potassium: 4.2 mmol/L (ref 3.5–5.1)
Sodium: 142 mmol/L (ref 135–145)
Total Bilirubin: 0.8 mg/dL (ref 0.3–1.2)
Total Protein: 6.7 g/dL (ref 6.5–8.1)

## 2019-07-18 LAB — CBC WITH DIFFERENTIAL (CANCER CENTER ONLY)
Abs Immature Granulocytes: 0.02 10*3/uL (ref 0.00–0.07)
Basophils Absolute: 0.1 10*3/uL (ref 0.0–0.1)
Basophils Relative: 1 %
Eosinophils Absolute: 0.3 10*3/uL (ref 0.0–0.5)
Eosinophils Relative: 4 %
HCT: 40.7 % (ref 39.0–52.0)
Hemoglobin: 13.8 g/dL (ref 13.0–17.0)
Immature Granulocytes: 0 %
Lymphocytes Relative: 27 %
Lymphs Abs: 1.8 10*3/uL (ref 0.7–4.0)
MCH: 31.4 pg (ref 26.0–34.0)
MCHC: 33.9 g/dL (ref 30.0–36.0)
MCV: 92.5 fL (ref 80.0–100.0)
Monocytes Absolute: 0.6 10*3/uL (ref 0.1–1.0)
Monocytes Relative: 10 %
Neutro Abs: 3.9 10*3/uL (ref 1.7–7.7)
Neutrophils Relative %: 58 %
Platelet Count: 186 10*3/uL (ref 150–400)
RBC: 4.4 MIL/uL (ref 4.22–5.81)
RDW: 12.4 % (ref 11.5–15.5)
WBC Count: 6.7 10*3/uL (ref 4.0–10.5)
nRBC: 0 % (ref 0.0–0.2)

## 2019-07-18 MED ORDER — BICALUTAMIDE 50 MG PO TABS: 50.0000 mg | ORAL_TABLET | Freq: Every day | ORAL | 12 refills | Status: DC

## 2019-07-18 NOTE — Progress Notes (Signed)
Initial RN Navigator Patient Visit  Name: Frederick Cruz Date of Referral : 06/26/19 Diagnosis: Prostate Cancer  Met with patient prior to their visit with MD. [name] patient "Your Patient Navigator" handout which explains my role, areas in which I am able to help, and all the contact information for myself and the office. Also gave patient MD and Navigator business card. Reviewed with patient the general overview of expected course after initial diagnosis and time frame for all steps to be completed.  Patient has an established diagnosis and is transferring care to this office. He has no current needs and no further workup is necessary. I have given him all new patient education including Advanced Directive, and MyChart, as well as my contact information. He knows to reach out to me if there are any issues or concerns.   Active navigation not needed for this patient, but I am available to him as needed.

## 2019-07-18 NOTE — Progress Notes (Addendum)
Referral MD  Reason for Referral: Recurrent prostate cancer-castrate sensitive  Chief Complaint  Patient presents with  . New Patient (Initial Visit)  : I think my prostate cancer came back.  HPI: Frederick Cruz is a very nice 63 year old white male.  He served in [employer].  As such, he is a true American hero.  He is originally from [place].  He has a [place].  He says company was involved partly with the building of the bypass around [place].  He was found to have a PSA of 30 on a routine physical.  He is not sure if he ever had a PSA checked before.  He underwent prostate biopsies.  This was done on 08/16/2018.  The pathology report ([id number]) showed relatively high-grade prostate cancer.  I think he had 9 out of 12 biopsies positive.  The majority of the biopsies were Gleason score 8.  He subsequently had surgery down in [place].  He was down in [place].  He had routine preop studies which did not show any evidence of of metastatic or locally advanced disease.  He underwent a prostatectomy via robotic surgery.  This was done on 12/20/2018.  The pathology report seem to show a certain degree of locally advanced prostate cancer.  The pathology report ([id number]) showed T4 prostate cancer.  The Gleason score was 9.  There was positive left and right bladder neck margins.  There was some lymphatic invasion noted.  There was some perineural invasion.  Seminal vesicles were also invaded.  By the pathology report.  I would have to think that he had stage IIIb (T4N0M0) relatively high-grade prostate cancer.  He did not have any type of adjuvant therapy.  Of note, he was found to have 4- lymph nodes.  He has been followed at [facility].  Is initial postop PSA was 0.07.  However, his PSA has been slowly trending upward.  In April of the PSA was 0.12.  In early July, the PSA was up to 0.36.  He subsequently underwent a prostate-specific PET scan.  This was done on  07/16/2019.  This did show uptake in a right external iliac lymph node.  It only measures 5 mm but had uptake with an SUV of 6.1.  There was no evidence of local recurrence in the prostate bed.  There is no evidence of metastatic disease to the bones.  He had a MRI of the pelvis.  This was done on 07/08/2019.  This looked okay without any obvious evidence of recurrent disease.  I think that it is apparent that he does have recurrence that I would consider to be distant given that this is an external iliac lymph node.  He has no pelvic pain.  He has had no problems with bowels or bladder.  He has had no incontinence after the surgery.  There is no bony pain.  He has had no chest pain.  He has had some palpitations.  There is no weight loss or weight gain.  There is no history of prostate cancer in the family.  I would have to say that overall, his performance status is ECOG 0.     Past Medical History:  Diagnosis Date  . Arthritis   . Cancer [facility])    prostate  . Ringing in ears   :  Past Surgical History:  Procedure Laterality Date  . BACK SURGERY    . KNEE ARTHROSCOPY     LEFT  . left shoulder surgery  2017  .  PROSTATE SURGERY  12/20/2018  . PROSTATECTOMY  12/2018  . TOTAL KNEE ARTHROPLASTY Right 10/23/2013   Procedure: RIGHT TOTAL KNEE ARTHROPLASTY;  Surgeon: Tobi Bastos, MD;  Location: [facility];  Service: Orthopedics;  Laterality: Right;  :   Current Outpatient Medications:  .  GLUTATHIONE PO, Take by mouth., Disp: , Rfl:  .  atorvastatin (LIPITOR) 10 MG tablet, Take 10 mg by mouth daily., Disp: , Rfl:  .  Calcium-Magnesium 300-300 MG TABS, Take 1 tablet by mouth daily., Disp: , Rfl:  .  escitalopram (LEXAPRO) 5 MG tablet, Take 5 mg by mouth daily., Disp: , Rfl:  .  metoprolol succinate (TOPROL-XL) 25 MG 24 hr tablet, Take 25 mg by mouth daily., Disp: , Rfl:  .  Multiple Vitamins-Minerals (CENTRUM SILVER 50+MEN PO), Take 1 tablet by mouth daily., Disp: , Rfl:  .   Omega-3 Fatty Acids (FISH OIL) 1000 MG CAPS, Take 1 capsule by mouth daily., Disp: , Rfl:  .  sildenafil (VIAGRA) 100 MG tablet, Take 100 mg by mouth daily as needed for erectile dysfunction., Disp: , Rfl:  .  Turmeric 400 MG CAPS, Take by mouth., Disp: , Rfl:  .  vitamin C (ASCORBIC ACID) 500 MG tablet, Take 500 mg by mouth daily., Disp: , Rfl:  .  vitamin E 400 UNIT capsule, Take 400 Units by mouth daily., Disp: , Rfl: :  :  Allergies  Allergen Reactions  . Clonazepam Other (See Comments)    Double vision  :  Family History  Problem Relation Age of Onset  . Heart Problems Brother   . Heart Problems Brother   :  Social History   Socioeconomic History  . Marital status: Married    Spouse name: Not on file  . Number of children: 1  . Years of education: Not on file  . Highest education level: Not on file  Occupational History  . Not on file  Social Needs  . Financial resource strain: Not on file  . Food insecurity    Worry: Not on file    Inability: Not on file  . Transportation needs    Medical: Not on file    Non-medical: Not on file  Tobacco Use  . Smoking status: Former Smoker    Packs/day: 1.50    Years: 20.00    Pack years: 30.00    Types: Cigarettes    Quit date: 10/18/1984    Years since quitting: 34.7  . Smokeless tobacco: Never Used  Substance and Sexual Activity  . Alcohol use: Yes    Comment: OCCASIONAL  . Drug use: No  . Sexual activity: Not on file  Lifestyle  . Physical activity    Days per week: Not on file    Minutes per session: Not on file  . Stress: Not on file  Relationships  . Social Herbalist on phone: Not on file    Gets together: Not on file    Attends religious service: Not on file    Active member of club or organization: Not on file    Attends meetings of clubs or organizations: Not on file    Relationship status: Not on file  . Intimate partner violence    Fear of current or ex partner: Not on file     Emotionally abused: Not on file    Physically abused: Not on file    Forced sexual activity: Not on file  Other Topics Concern  . Not on file  Social History Narrative  . Not on file  :  Review of Systems  Constitutional: Negative.   HENT: Negative.   Eyes: Negative.   Respiratory: Negative.   Cardiovascular: Negative.   Gastrointestinal: Negative.   Genitourinary: Negative.   Musculoskeletal: Negative.   Skin: Negative.   Neurological: Negative.   Endo/Heme/Allergies: Negative.   Psychiatric/Behavioral: Negative.      Exam: Well-developed well-nourished white male in no obvious distress.  Vital signs show a temperature of 98.8.  Pulse 53.  Blood pressure 126/80.  Weight is 207 pounds.  Head neck exam shows no ocular or oral lesions.  He has no adenopathy in the neck.  Thyroid is nonpalpable.  Lungs are clear bilaterally.  Cardiac exam regular rate and rhythm with no murmurs, rubs or bruits.  Abdomen is soft.  He has good bowel sounds.  There is no palpable abdominal mass.  He has a well-healed robotic laparoscopy scars from his prostatectomy.  There is no fluid wave.  There is no palpable liver or spleen tip.  Back exam shows no tenderness over the spine, ribs or hips.  Extremities shows no clubbing, cyanosis or edema.  Skin exam shows no rashes, ecchymoses or petechia.  @IPVITALS @   Recent Labs    07/18/19 1336  WBC 6.7  HGB 13.8  HCT 40.7  PLT 186   Recent Labs    07/18/19 1336  NA 142  K 4.2  CL 106  CO2 28  GLUCOSE 88  BUN 23  CREATININE 0.86  CALCIUM 9.0    Blood smear review: None  Pathology: See above    Assessment and Plan: Frederick Cruz is a really nice 63 year old white male.  He had what I would consider a locally advanced prostate cancer-stage IIIb-that was removed.  He had perineural invasion.  He had lymphatic vessel invasion.  What really troubles me is at this surgery was only 7 months ago.  He now has recurrence by his PSA going up and by  the PET scan.  I really would consider him at high risk for metastatic disease that is microscopic.  I have to believe that this cancer is testosterone sensitive.  As such, our initial goal of therapy is to deplete his testosterone.  As such, ADT is our goal at this point.  I talked to Frederick Cruz at length.  I spent about an hour with him.  I explained to him that his prostate cancer, coming back so quickly after surgery, is a ominous prognostic factor for him.  I told him we have to be aggressive with treating this.  Again, I would treat this systemically for right now.  I don't think he needs radiation therapy since there is nothing that is in the prostate bed where he had a prostatectomy.  I went over the side effects of testosterone depletion.  I told him that he essentially would be going through "male menopause."  He would easily have hot flashes and sweats.  He may have less energy.  He may have been some memory difficulties.  He may have some weight issues.  I we will start him on degarelix so that we can rapidly deplete his testosterone.  After this, I will then go to Lupron.  I also want to get him on Casodex.  I think this would be a reasonable way to go to try to help further deplete his testosterone that is being made by his adrenal glands.  I do not think we have to use  any type of bisphosphonate for Xgeva.  There is no obvious bone metastasis.  I just have a bad feeling about this cancer.  It has a high Gleason grade.  I answered all of his questions.  I will call in the prescription for the Casodex.  We will try to get the degarelix started next week.  I would like to see him back in 1 month.  He will be interesting to see what his PSA is right now.  I suppose that his prostate cancer may not be that adept at making a PSA if his Gleason score was 9.  I probably would repeat his PET scan in about 3 months.

## 2019-07-19 ENCOUNTER — Encounter: Payer: Self-pay | Admitting: *Deleted

## 2019-07-19 LAB — PSA, TOTAL AND FREE
PSA, Free Pct: 38.3 %
PSA, Free: 0.23 ng/mL
Prostate Specific Ag, Serum: 0.6 ng/mL (ref 0.0–4.0)

## 2019-07-19 LAB — TESTOSTERONE: Testosterone: 398 ng/dL (ref 264–916)

## 2019-07-19 NOTE — Progress Notes (Signed)
Reviewed patient visit after seeing Dr Marin Olp yesterday. Patient will need  Degarelix injection - scheduled for 8/20 at 2pm Start monthly Lupron - will be scheduled when patient is here next week  Called and spoke to the patient's wife, Lovey Newcomer. Reviewed the treatment plan. She had many questions about the medication, scheduling, and side effects. I answered all questions to her satisfaction.  She is aware of patient's appointment next week. She has my number if she has any questions or concerns.

## 2019-07-25 ENCOUNTER — Inpatient Hospital Stay: Payer: Managed Care, Other (non HMO)

## 2019-07-25 ENCOUNTER — Other Ambulatory Visit: Payer: Self-pay

## 2019-07-25 VITALS — BP 115/72 | HR 51 | Temp 98.0°F | Resp 18

## 2019-07-25 DIAGNOSIS — Z5111 Encounter for antineoplastic chemotherapy: Secondary | ICD-10-CM | POA: Diagnosis not present

## 2019-07-25 DIAGNOSIS — C61 Malignant neoplasm of prostate: Secondary | ICD-10-CM

## 2019-07-25 MED ORDER — DEGARELIX ACETATE(240 MG DOSE) 120 MG/VIAL ~~LOC~~ SOLR
240.0000 mg | Freq: Once | SUBCUTANEOUS | Status: AC
  Administered 2019-07-25: 240 mg via SUBCUTANEOUS
  Filled 2019-07-25: qty 6

## 2019-07-25 NOTE — Patient Instructions (Signed)
Degarelix injection What is this medicine? DEGARELIX (deg a REL ix) is used to treat men with advanced prostate cancer. This medicine may be used for other purposes; ask your health care provider or pharmacist if you have questions. COMMON BRAND NAME(S): Degarelix, Firmagon What should I tell my health care provider before I take this medicine? They need to know if you have any of these conditions:  diabetes  heart disease  kidney disease  liver disease  low levels of potassium or magnesium in the blood  osteoporosis  an unusual or allergic reaction to degarelix, mannitol, other medicines, foods, dyes, or preservatives  pregnant or trying to get pregnant  breast-feeding How should I use this medicine? This medicine is for injection under the skin. It is usually given by a health care professional in a hospital or clinic setting. If you get this medicine at home, you will be taught how to prepare and give this medicine. Use exactly as directed. Take your medicine at regular intervals. Do not take it more often than directed. It is important that you put your used needles and syringes in a special sharps container. Do not put them in a trash can. If you do not have a sharps container, call your pharmacist or healthcare provider to get one. Talk to your pediatrician regarding the use of this medicine in children. Special care may be needed. Overdosage: If you think you have taken too much of this medicine contact a poison control center or emergency room at once. NOTE: This medicine is only for you. Do not share this medicine with others. What if I miss a dose? Try not to miss a dose. If you do miss a dose, call your doctor or health care professional for advice. What may interact with this medicine? Do not take this medicine with any of the following medications:  amiodarone  bretylium  disopyramide  droperidol  ibutilide  procainamide  quinidine  sotalol This  medicine may also interact with the following medications:  dofetilide This list may not describe all possible interactions. Give your health care provider a list of all the medicines, herbs, non-prescription drugs, or dietary supplements you use. Also tell them if you smoke, drink alcohol, or use illegal drugs. Some items may interact with your medicine. What should I watch for while using this medicine? Visit your doctor or health care professional for regular checks on your progress and discuss any issues before you start taking this medicine. Do not rub or scratch injection site. There may be a lump at the injection site, or it may be red or sore for a few days after your dose. Your doctor or health care professional will need to monitor your hormone levels in your blood to check your response to treatment. Try to keep any appointments for testing. What side effects may I notice from receiving this medicine? Side effects that you should report to your doctor or health care professional as soon as possible:  allergic reactions like skin rash, itching or hives, swelling of the face, lips, or tongue  fever or chills  irregular heartbeat  nausea and vomiting along with severe abdominal pain  pain or difficulty passing urine  pelvic pain or bloating  signs and symptoms of high blood sugar such as being more thirsty or hungry or having to urinate more than normal. You may also feel very tired or have blurry vision Side effects that usually do not require medical attention (report to your doctor or health   care professional if they continue or are bothersome):  change in sex drive or performance  constipation  headache  high blood pressure  hot flashes (flushing of skin, increased sweating)  itching, redness or mild pain at site where injected  joint pain  trouble sleeping  unusually weak or tired  weight gain This list may not describe all possible side effects. Call your  doctor for medical advice about side effects. You may report side effects to FDA at 1-800-FDA-1088. Where should I keep my medicine? Keep out of the reach of children. This drug is usually given in a hospital or clinic and will not be stored at home. In rare cases, this medicine may be given at home. If you are using this medicine at home, you will be instructed on how to store this medicine. Throw away any unused medicine after the expiration date on the label. NOTE: This sheet is a summary. It may not cover all possible information. If you have questions about this medicine, talk to your doctor, pharmacist, or health care provider.  2020 Elsevier/Gold Standard (2019-02-04 12:20:52)  

## 2019-08-15 ENCOUNTER — Inpatient Hospital Stay: Payer: Managed Care, Other (non HMO)

## 2019-08-15 ENCOUNTER — Encounter: Payer: Self-pay | Admitting: Hematology & Oncology

## 2019-08-15 ENCOUNTER — Inpatient Hospital Stay: Payer: Managed Care, Other (non HMO) | Attending: Hematology & Oncology

## 2019-08-15 ENCOUNTER — Other Ambulatory Visit: Payer: Self-pay

## 2019-08-15 ENCOUNTER — Inpatient Hospital Stay (HOSPITAL_BASED_OUTPATIENT_CLINIC_OR_DEPARTMENT_OTHER): Payer: Managed Care, Other (non HMO) | Admitting: Hematology & Oncology

## 2019-08-15 VITALS — BP 125/75 | HR 48 | Temp 98.4°F | Resp 16 | Wt 206.0 lb

## 2019-08-15 DIAGNOSIS — C61 Malignant neoplasm of prostate: Secondary | ICD-10-CM | POA: Diagnosis not present

## 2019-08-15 DIAGNOSIS — C775 Secondary and unspecified malignant neoplasm of intrapelvic lymph nodes: Secondary | ICD-10-CM

## 2019-08-15 DIAGNOSIS — Z5111 Encounter for antineoplastic chemotherapy: Secondary | ICD-10-CM | POA: Insufficient documentation

## 2019-08-15 LAB — CMP (CANCER CENTER ONLY)
ALT: 27 U/L (ref 0–44)
AST: 26 U/L (ref 15–41)
Albumin: 4.5 g/dL (ref 3.5–5.0)
Alkaline Phosphatase: 45 U/L (ref 38–126)
Anion gap: 7 (ref 5–15)
BUN: 26 mg/dL — ABNORMAL HIGH (ref 8–23)
CO2: 30 mmol/L (ref 22–32)
Calcium: 9.8 mg/dL (ref 8.9–10.3)
Chloride: 107 mmol/L (ref 98–111)
Creatinine: 0.85 mg/dL (ref 0.61–1.24)
GFR, Est AFR Am: >60 mL/min (ref >60)
GFR, Estimated: >60 mL/min (ref >60)
Glucose, Bld: 97 mg/dL (ref 70–99)
Potassium: 4.4 mmol/L (ref 3.5–5.1)
Sodium: 144 mmol/L (ref 135–145)
Total Bilirubin: 0.8 mg/dL (ref 0.3–1.2)
Total Protein: 6.9 g/dL (ref 6.5–8.1)

## 2019-08-15 LAB — CBC WITH DIFFERENTIAL (CANCER CENTER ONLY)
Abs Immature Granulocytes: 0.01 10*3/uL (ref 0.00–0.07)
Basophils Absolute: 0.1 10*3/uL (ref 0.0–0.1)
Basophils Relative: 1 %
Eosinophils Absolute: 0.2 10*3/uL (ref 0.0–0.5)
Eosinophils Relative: 5 %
HCT: 40 % (ref 39.0–52.0)
Hemoglobin: 13.4 g/dL (ref 13.0–17.0)
Immature Granulocytes: 0 %
Lymphocytes Relative: 35 %
Lymphs Abs: 1.6 10*3/uL (ref 0.7–4.0)
MCH: 31.2 pg (ref 26.0–34.0)
MCHC: 33.5 g/dL (ref 30.0–36.0)
MCV: 93 fL (ref 80.0–100.0)
Monocytes Absolute: 0.5 10*3/uL (ref 0.1–1.0)
Monocytes Relative: 10 %
Neutro Abs: 2.3 10*3/uL (ref 1.7–7.7)
Neutrophils Relative %: 49 %
Platelet Count: 180 10*3/uL (ref 150–400)
RBC: 4.3 MIL/uL (ref 4.22–5.81)
RDW: 12.1 % (ref 11.5–15.5)
WBC Count: 4.6 10*3/uL (ref 4.0–10.5)
nRBC: 0 % (ref 0.0–0.2)

## 2019-08-15 MED ORDER — LEUPROLIDE ACETATE (3 MONTH) 22.5 MG ~~LOC~~ KIT
22.5000 mg | PACK | Freq: Once | SUBCUTANEOUS | Status: AC
  Administered 2019-08-15: 22.5 mg via SUBCUTANEOUS
  Filled 2019-08-15: qty 22.5

## 2019-08-15 NOTE — Patient Instructions (Signed)

## 2019-08-16 ENCOUNTER — Telehealth: Payer: Self-pay | Admitting: *Deleted

## 2019-08-16 ENCOUNTER — Telehealth: Payer: Self-pay | Admitting: Hematology & Oncology

## 2019-08-16 LAB — PSA, TOTAL AND FREE
PSA, Free Pct: UNDETERMINED %
PSA, Free: <0.02 ng/mL
Prostate Specific Ag, Serum: <0.1 ng/mL (ref 0.0–4.0)

## 2019-08-16 LAB — TESTOSTERONE: Testosterone: <3 ng/dL — ABNORMAL LOW (ref 264–916)

## 2019-08-16 NOTE — Telephone Encounter (Signed)
-----   Message from [name], MD sent at 08/16/2019  6:48 AM EDT ----- Call - the PSA is now undetectable!!!  Great job!!!!  [name]

## 2019-08-16 NOTE — Telephone Encounter (Signed)
Called and spoke with patient's wife regarding appointments added per 9/10 los

## 2019-08-16 NOTE — Progress Notes (Signed)
Hematology and Oncology Follow Up Visit  Frederick Cruz [id number] 1956/05/21 63 y.o. 08/16/2019   Principle Diagnosis:   Metastatic prostate cancer-castrate responsive  Current Therapy:    Eligard 22.5 mg subcu every 3 months-next dose in 11/2019  Casodex 50 mg p.o. daily     Interim History:  Frederick Cruz is back for his second office visit.  He is doing okay.  We started him on androgen deprivation therapy.  As expected, he has the hot flashes.  When we did his PET scan.  Thankfully, all it showed was he had active adenopathy in the right external iliac lymph node.  When we first saw him, his PSA was 0.6.  This is not all that surprising as he had a high Gleason score.  We first saw him, his testosterone was 400.  We clearly had to get his testosterone level down in order for therapy to be effective.  We have done that.  His testosterone level today is 3.  His PSA is now less than 0.1.  He has had no problems with pain.  He has had no issues with bowels or bladder.  He is still working.  He has had no rashes.  There is been no cough.  He has had no leg swelling.  Overall, I would say his performance status is ECOG 1.  Medications:  Current Outpatient Medications:  .  atorvastatin (LIPITOR) 10 MG tablet, Take 10 mg by mouth daily., Disp: , Rfl:  .  bicalutamide (CASODEX) 50 MG tablet, Take 1 tablet (50 mg total) by mouth daily., Disp: 30 tablet, Rfl: 12 .  Calcium-Magnesium 300-300 MG TABS, Take 1 tablet by mouth daily., Disp: , Rfl:  .  escitalopram (LEXAPRO) 5 MG tablet, Take 5 mg by mouth daily., Disp: , Rfl:  .  GLUTATHIONE PO, Take by mouth., Disp: , Rfl:  .  metoprolol succinate (TOPROL-XL) 25 MG 24 hr tablet, Take 25 mg by mouth daily., Disp: , Rfl:  .  Multiple Vitamins-Minerals (CENTRUM SILVER 50+MEN PO), Take 1 tablet by mouth daily., Disp: , Rfl:  .  Omega-3 Fatty Acids (FISH OIL) 1000 MG CAPS, Take 1 capsule by mouth daily., Disp: , Rfl:  .  sildenafil  (VIAGRA) 100 MG tablet, Take 100 mg by mouth daily as needed for erectile dysfunction., Disp: , Rfl:  .  Turmeric 400 MG CAPS, Take by mouth., Disp: , Rfl:  .  vitamin C (ASCORBIC ACID) 500 MG tablet, Take 500 mg by mouth daily., Disp: , Rfl:  .  vitamin E 400 UNIT capsule, Take 400 Units by mouth daily., Disp: , Rfl:   Allergies:  Allergies  Allergen Reactions  . Clonazepam Other (See Comments)    Double vision    Past Medical History, Surgical history, Social history, and Family History were reviewed and updated.  Review of Systems: Review of Systems  Constitutional: Negative.   HENT:  Negative.   Eyes: Negative.   Respiratory: Negative.   Cardiovascular: Negative.   Gastrointestinal: Negative.   Endocrine: Positive for hot flashes.  Genitourinary: Negative.    Musculoskeletal: Negative.   Skin: Negative.   Neurological: Negative.   Hematological: Negative.   Psychiatric/Behavioral: Negative.     Physical Exam:  weight is 206 lb (93.4 kg). His temporal temperature is 98.4 F (36.9 C). His blood pressure is 125/75 and his pulse is 48 (abnormal). His respiration is 16 and oxygen saturation is 99%.   Wt Readings from Last 3 Encounters:  08/15/19 206 lb (  93.4 kg)  07/18/19 207 lb (93.9 kg)  07/03/19 210 lb (95.3 kg)    Physical Exam Vitals signs reviewed.  HENT:     Head: Normocephalic and atraumatic.  Eyes:     Pupils: Pupils are equal, round, and reactive to light.  Neck:     Musculoskeletal: Normal range of motion.  Cardiovascular:     Rate and Rhythm: Normal rate and regular rhythm.     Heart sounds: Normal heart sounds.  Pulmonary:     Effort: Pulmonary effort is normal.     Breath sounds: Normal breath sounds.  Abdominal:     General: Bowel sounds are normal.     Palpations: Abdomen is soft.  Musculoskeletal: Normal range of motion.        General: No tenderness or deformity.  Lymphadenopathy:     Cervical: No cervical adenopathy.  Skin:    General:  Skin is warm and dry.     Findings: No erythema or rash.  Neurological:     Mental Status: He is alert and oriented to person, place, and time.  Psychiatric:        Behavior: Behavior normal.        Thought Content: Thought content normal.        Judgment: Judgment normal.      Lab Results  Component Value Date   WBC 4.6 08/15/2019   HGB 13.4 08/15/2019   HCT 40.0 08/15/2019   MCV 93.0 08/15/2019   PLT 180 08/15/2019     Chemistry      Component Value Date/Time   NA 144 08/15/2019 1507   K 4.4 08/15/2019 1507   CL 107 08/15/2019 1507   CO2 30 08/15/2019 1507   BUN 26 (H) 08/15/2019 1507   CREATININE 0.85 08/15/2019 1507      Component Value Date/Time   CALCIUM 9.8 08/15/2019 1507   ALKPHOS 45 08/15/2019 1507   AST 26 08/15/2019 1507   ALT 27 08/15/2019 1507   BILITOT 0.8 08/15/2019 1507       Impression and Plan: Frederick Cruz is a 63 year old white male.  He has metastatic prostate cancer.  He clearly has oligo metastatic disease.  He has a PET scan that only shows 1 area of activity, that is in a external iliac lymph node.  I am glad that androgen deprivation has already work.  His PSA is now not detectable.  He is castrate level testosterone.  I feel bad that he does have the side effects from low testosterone.  I probably would not repeat a another PET scan until we see him back.  He gets his Eligard today.  We will now have 1 every 53-month Eligard.  We might be able to move this out even every 4 to 48-month injections depending on how things are going for him.  Hopefully, his quality of life would not be compromised too much by the androgen deprivation.  Given that he does have the high grade prostate cancer with the high Gleason score when this was first resected, I know that he clearly is at risk for recurrence again.  I really do not think we have to give him chemotherapy at this point time.  I do still think he has enough metastatic disease that warrants  the toxicity of chemotherapy.  I will plan to do another PET scan in about 2 months or so.  We will plan to get him back in 3 months to see [place].  He will get his  Eligard when we see him back.  I spent about 35 minutes with him today.  I had to review his labs.  I went over his PET scan.  I had to make some adjustments with his protocol.   [name], MD 9/11/20207:10 AM

## 2019-08-16 NOTE — Telephone Encounter (Signed)
Pt notified per order of Dr. Marin Olp that "the PSA is now undetectable!!!  Great job!!!"  Pt appreciative of call and has no questions or concerns at this time.

## 2019-09-29 ENCOUNTER — Other Ambulatory Visit: Payer: Self-pay | Admitting: Cardiology

## 2019-09-29 DIAGNOSIS — I493 Ventricular premature depolarization: Secondary | ICD-10-CM

## 2019-10-23 ENCOUNTER — Encounter: Payer: Self-pay | Admitting: *Deleted

## 2019-10-23 NOTE — Progress Notes (Signed)
Patient's wife called because she is having difficulty getting PET scan scheduled.  Investigated issue and patient had a recent insurance change which hadn't been fully investigated. Patient requires [facility]  PET Scan scheduled. Called the wife and gave her all appointment information including NPO status and low carb breakfast. I also gave her the number to [facility] in case she needed to reschedule the appointment.

## 2019-11-05 ENCOUNTER — Encounter (HOSPITAL_COMMUNITY)
Admission: RE | Admit: 2019-11-05 | Discharge: 2019-11-05 | Disposition: A | Discharge status: Home / Self Care | Payer: Medicare Other | Source: Ambulatory Visit | Attending: Hematology & Oncology | Admitting: Hematology & Oncology

## 2019-11-05 ENCOUNTER — Other Ambulatory Visit: Payer: Self-pay

## 2019-11-05 DIAGNOSIS — C61 Malignant neoplasm of prostate: Secondary | ICD-10-CM

## 2019-11-05 DIAGNOSIS — C775 Secondary and unspecified malignant neoplasm of intrapelvic lymph nodes: Secondary | ICD-10-CM | POA: Diagnosis present

## 2019-11-05 MED ORDER — AXUMIN (FLUCICLOVINE F 18) INJECTION
10.4200 | Freq: Once | INTRAVENOUS | Status: AC
  Administered 2019-11-05: 14:00:00 10.42 via INTRAVENOUS

## 2019-11-07 ENCOUNTER — Telehealth: Payer: Self-pay | Admitting: *Deleted

## 2019-11-07 NOTE — Telephone Encounter (Signed)
Patient's wife notified per order of Dr. Marin Olp that "the prostate cancer is responding!!  The lymph node is no longer seen!!!  Angela Nevin Christmas!!"  Pt.s wife appreciative of call and has no questions or concerns at this time.

## 2019-11-07 NOTE — Telephone Encounter (Signed)
-----   Message from Frederick Napoleon, MD sent at 11/06/2019  4:48 PM EST ----- Call - the prostate cancer is responding!!  The lymph node is no longer seen!!!  Merry Christmas!!  [name]

## 2019-11-14 ENCOUNTER — Other Ambulatory Visit: Payer: Self-pay

## 2019-11-14 ENCOUNTER — Inpatient Hospital Stay: Payer: Medicare Other | Attending: Hematology & Oncology

## 2019-11-14 ENCOUNTER — Inpatient Hospital Stay: Payer: Medicare Other

## 2019-11-14 ENCOUNTER — Encounter: Payer: Self-pay | Admitting: Hematology & Oncology

## 2019-11-14 ENCOUNTER — Inpatient Hospital Stay (HOSPITAL_BASED_OUTPATIENT_CLINIC_OR_DEPARTMENT_OTHER): Payer: Medicare Other | Admitting: Hematology & Oncology

## 2019-11-14 VITALS — BP 120/82 | HR 51 | Temp 97.5°F | Resp 16 | Wt 213.0 lb

## 2019-11-14 DIAGNOSIS — C775 Secondary and unspecified malignant neoplasm of intrapelvic lymph nodes: Secondary | ICD-10-CM | POA: Diagnosis not present

## 2019-11-14 DIAGNOSIS — C61 Malignant neoplasm of prostate: Secondary | ICD-10-CM

## 2019-11-14 DIAGNOSIS — Z5111 Encounter for antineoplastic chemotherapy: Secondary | ICD-10-CM | POA: Diagnosis present

## 2019-11-14 LAB — CMP (CANCER CENTER ONLY)
ALT: 23 U/L (ref 0–44)
AST: 24 U/L (ref 15–41)
Albumin: 4.9 g/dL (ref 3.5–5.0)
Alkaline Phosphatase: 40 U/L (ref 38–126)
Anion gap: 6 (ref 5–15)
BUN: 25 mg/dL — ABNORMAL HIGH (ref 8–23)
CO2: 29 mmol/L (ref 22–32)
Calcium: 9.5 mg/dL (ref 8.9–10.3)
Chloride: 106 mmol/L (ref 98–111)
Creatinine: 0.95 mg/dL (ref 0.61–1.24)
GFR, Est AFR Am: >60 mL/min (ref >60)
GFR, Estimated: >60 mL/min (ref >60)
Glucose, Bld: 99 mg/dL (ref 70–99)
Potassium: 4.3 mmol/L (ref 3.5–5.1)
Sodium: 141 mmol/L (ref 135–145)
Total Bilirubin: 0.6 mg/dL (ref 0.3–1.2)
Total Protein: 7.2 g/dL (ref 6.5–8.1)

## 2019-11-14 LAB — CBC WITH DIFFERENTIAL (CANCER CENTER ONLY)
Abs Immature Granulocytes: 0.01 10*3/uL (ref 0.00–0.07)
Basophils Absolute: 0.1 10*3/uL (ref 0.0–0.1)
Basophils Relative: 1 %
Eosinophils Absolute: 0.2 10*3/uL (ref 0.0–0.5)
Eosinophils Relative: 4 %
HCT: 37.3 % — ABNORMAL LOW (ref 39.0–52.0)
Hemoglobin: 12.8 g/dL — ABNORMAL LOW (ref 13.0–17.0)
Immature Granulocytes: 0 %
Lymphocytes Relative: 29 %
Lymphs Abs: 1.8 10*3/uL (ref 0.7–4.0)
MCH: 31.6 pg (ref 26.0–34.0)
MCHC: 34.3 g/dL (ref 30.0–36.0)
MCV: 92.1 fL (ref 80.0–100.0)
Monocytes Absolute: 0.6 10*3/uL (ref 0.1–1.0)
Monocytes Relative: 10 %
Neutro Abs: 3.4 10*3/uL (ref 1.7–7.7)
Neutrophils Relative %: 56 %
Platelet Count: 176 10*3/uL (ref 150–400)
RBC: 4.05 MIL/uL — ABNORMAL LOW (ref 4.22–5.81)
RDW: 12.4 % (ref 11.5–15.5)
WBC Count: 6.1 10*3/uL (ref 4.0–10.5)
nRBC: 0 % (ref 0.0–0.2)

## 2019-11-14 MED ORDER — LEUPROLIDE ACETATE (3 MONTH) 22.5 MG ~~LOC~~ KIT
22.5000 mg | PACK | Freq: Once | SUBCUTANEOUS | Status: AC
  Administered 2019-11-14: 22.5 mg via SUBCUTANEOUS
  Filled 2019-11-14: qty 22.5

## 2019-11-14 NOTE — Progress Notes (Signed)
Hematology and Oncology Follow Up Visit  Frederick Cruz [id number] [birth year] 63 y.o. 11/14/2019   Principle Diagnosis:   Metastatic prostate cancer-castrate responsive  Current Therapy:    Eligard 22.5 mg subcu every 3 months-next dose in 02/2020  Casodex 50 mg p.o. daily     Interim History:  Frederick Cruz is back for follow-up.  He is doing quite well right now.  We have ablated his testosterone.  Back in September, his testosterone level was less than 3.  At that time, his PSA was less than 0.1.  Even better is the fact that his axumin PET scan showed that he was responding.  His active right external iliac lymph node was no longer present.  There is no evidence of disease elsewhere.  He is going to retire at the end of the year.  He is looking forward to this.  He does have some hot flashes and sweats.  I am not surprised by this.  He has had no problems with bowels or bladder.  There is been no cough or shortness of breath.  He has had no chest wall pain.  He has had no leg swelling.  He has had no rashes.  Overall, I would say his performance status is ECOG 0.    Medications:  Current Outpatient Medications:  .  atorvastatin (LIPITOR) 10 MG tablet, Take 10 mg by mouth daily., Disp: , Rfl:  .  bicalutamide (CASODEX) 50 MG tablet, Take 1 tablet (50 mg total) by mouth daily., Disp: 30 tablet, Rfl: 12 .  Calcium-Magnesium 300-300 MG TABS, Take 1 tablet by mouth daily., Disp: , Rfl:  .  escitalopram (LEXAPRO) 5 MG tablet, Take 5 mg by mouth daily., Disp: , Rfl:  .  GLUTATHIONE PO, Take by mouth., Disp: , Rfl:  .  metoprolol succinate (TOPROL-XL) 25 MG 24 hr tablet, TAKE 1 TABLET BY MOUTH EVERY DAY, Disp: 90 tablet, Rfl: 2 .  Multiple Vitamins-Minerals (CENTRUM SILVER 50+MEN PO), Take 1 tablet by mouth daily., Disp: , Rfl:  .  Omega-3 Fatty Acids (FISH OIL) 1000 MG CAPS, Take 1 capsule by mouth daily., Disp: , Rfl:  .  sildenafil (VIAGRA) 100 MG tablet, Take 100 mg by  mouth daily as needed for erectile dysfunction., Disp: , Rfl:  .  Turmeric 400 MG CAPS, Take by mouth., Disp: , Rfl:  .  vitamin C (ASCORBIC ACID) 500 MG tablet, Take 500 mg by mouth daily., Disp: , Rfl:  .  vitamin E 400 UNIT capsule, Take 400 Units by mouth daily., Disp: , Rfl:  No current facility-administered medications for this visit.  Facility-Administered Medications Ordered in Other Visits:  .  Leuprolide Acetate (3 Month) (ELIGARD) 22.5 MG injection 22.5 mg, 22.5 mg, Subcutaneous, Once, Ortha Metts, Rudell Cobb, MD  Allergies:  Allergies  Allergen Reactions  . Clonazepam Other (See Comments)    Double vision    Past Medical History, Surgical history, Social history, and Family History were reviewed and updated.  Review of Systems: Review of Systems  Constitutional: Negative.   HENT:  Negative.   Eyes: Negative.   Respiratory: Negative.   Cardiovascular: Negative.   Gastrointestinal: Negative.   Endocrine: Positive for hot flashes.  Genitourinary: Negative.    Musculoskeletal: Negative.   Skin: Negative.   Neurological: Negative.   Hematological: Negative.   Psychiatric/Behavioral: Negative.     Physical Exam:  weight is 213 lb (96.6 kg). His temporal temperature is 97.5 F (36.4 C) (abnormal). His blood pressure is 120/82  and his pulse is 51 (abnormal). His respiration is 16 and oxygen saturation is 97%.   Wt Readings from Last 3 Encounters:  11/14/19 213 lb (96.6 kg)  08/15/19 206 lb (93.4 kg)  07/18/19 207 lb (93.9 kg)    Physical Exam Vitals reviewed.  HENT:     Head: Normocephalic and atraumatic.  Eyes:     Pupils: Pupils are equal, round, and reactive to light.  Cardiovascular:     Rate and Rhythm: Normal rate and regular rhythm.     Heart sounds: Normal heart sounds.  Pulmonary:     Effort: Pulmonary effort is normal.     Breath sounds: Normal breath sounds.  Abdominal:     General: Bowel sounds are normal.     Palpations: Abdomen is soft.    Musculoskeletal:        General: No tenderness or deformity. Normal range of motion.     Cervical back: Normal range of motion.  Lymphadenopathy:     Cervical: No cervical adenopathy.  Skin:    General: Skin is warm and dry.     Findings: No erythema or rash.  Neurological:     Mental Status: He is alert and oriented to person, place, and time.  Psychiatric:        Behavior: Behavior normal.        Thought Content: Thought content normal.        Judgment: Judgment normal.      Lab Results  Component Value Date   WBC 6.1 11/14/2019   HGB 12.8 (L) 11/14/2019   HCT 37.3 (L) 11/14/2019   MCV 92.1 11/14/2019   PLT 176 11/14/2019     Chemistry      Component Value Date/Time   NA 141 11/14/2019 1427   K 4.3 11/14/2019 1427   CL 106 11/14/2019 1427   CO2 29 11/14/2019 1427   BUN 25 (H) 11/14/2019 1427   CREATININE 0.95 11/14/2019 1427      Component Value Date/Time   CALCIUM 9.5 11/14/2019 1427   ALKPHOS 40 11/14/2019 1427   AST 24 11/14/2019 1427   ALT 23 11/14/2019 1427   BILITOT 0.6 11/14/2019 1427       Impression and Plan: Frederick Cruz is a 63 year old white male.  He has metastatic prostate cancer.  He clearly has oligo metastatic disease.  He has a PET scan that only shows 1 area of activity, that is in a external iliac lymph node.  I am glad that androgen deprivation has already work.  His PSA is now not detectable.  He has s castrate level testosterone.  I feel bad that he does have the side effects from low testosterone.  I probably would not repeat a another PET scan until 6 months.    He gets his Eligard today.  We will now have 1 every 40-month Eligard.    Hopefully, his quality of life would not be compromised too much by the androgen deprivation.  Given that he does have the high grade prostate cancer with the high Gleason score when this was first resected, I know that he clearly is at risk for recurrence again.  I really do not think we have to  give him chemotherapy at this point time.  I do still think he has enough metastatic disease that warrants the toxicity of chemotherapy.  We will have him come back in 3 months.  He will get his Eligard at that time.  [name], MD 12/10/20203:15 PM

## 2019-11-14 NOTE — Patient Instructions (Signed)

## 2019-11-15 ENCOUNTER — Encounter: Payer: Self-pay | Admitting: *Deleted

## 2019-11-15 LAB — PSA, TOTAL AND FREE
PSA, Free Pct: UNDETERMINED %
PSA, Free: <0.02 ng/mL
Prostate Specific Ag, Serum: <0.1 ng/mL (ref 0.0–4.0)

## 2019-11-15 LAB — TESTOSTERONE: Testosterone: <3 ng/dL — ABNORMAL LOW (ref 264–916)

## 2019-12-23 ENCOUNTER — Encounter: Payer: Self-pay | Admitting: *Deleted

## 2019-12-23 NOTE — Progress Notes (Signed)
Patient's wife, Lovey Newcomer, calling to notify us that patient is not sleeping well. This has been getting increasingly worse since starting the Lupron. He would like something to help with sleep.  Spoke with Dr Marin Olp. He would like patient to try Melatonin 5mg  qhs and see if he gets relief. If ineffective, he can call back and Dr Marin Olp will likely try Trazadone.   Notified the wife of Dr Antonieta Pert recommendation. She understood and knows to call back in about one week if ineffective.

## 2020-02-13 ENCOUNTER — Inpatient Hospital Stay: Payer: Medicare Other | Attending: Hematology & Oncology

## 2020-02-13 ENCOUNTER — Inpatient Hospital Stay: Payer: Medicare Other

## 2020-02-13 ENCOUNTER — Encounter: Payer: Self-pay | Admitting: Hematology & Oncology

## 2020-02-13 ENCOUNTER — Encounter: Payer: Self-pay | Admitting: *Deleted

## 2020-02-13 ENCOUNTER — Inpatient Hospital Stay (HOSPITAL_BASED_OUTPATIENT_CLINIC_OR_DEPARTMENT_OTHER): Payer: Medicare Other | Admitting: Hematology & Oncology

## 2020-02-13 ENCOUNTER — Other Ambulatory Visit: Payer: Self-pay

## 2020-02-13 VITALS — BP 119/77 | HR 57 | Temp 97.5°F | Resp 16 | Wt 219.0 lb

## 2020-02-13 DIAGNOSIS — C61 Malignant neoplasm of prostate: Secondary | ICD-10-CM

## 2020-02-13 DIAGNOSIS — Z5111 Encounter for antineoplastic chemotherapy: Secondary | ICD-10-CM | POA: Diagnosis not present

## 2020-02-13 DIAGNOSIS — E291 Testicular hypofunction: Secondary | ICD-10-CM | POA: Insufficient documentation

## 2020-02-13 DIAGNOSIS — C775 Secondary and unspecified malignant neoplasm of intrapelvic lymph nodes: Secondary | ICD-10-CM

## 2020-02-13 LAB — CBC WITH DIFFERENTIAL (CANCER CENTER ONLY)
Abs Immature Granulocytes: 0.01 10*3/uL (ref 0.00–0.07)
Basophils Absolute: 0.1 10*3/uL (ref 0.0–0.1)
Basophils Relative: 1 %
Eosinophils Absolute: 0.2 10*3/uL (ref 0.0–0.5)
Eosinophils Relative: 6 %
HCT: 38 % — ABNORMAL LOW (ref 39.0–52.0)
Hemoglobin: 12.9 g/dL — ABNORMAL LOW (ref 13.0–17.0)
Immature Granulocytes: 0 %
Lymphocytes Relative: 36 %
Lymphs Abs: 1.6 10*3/uL (ref 0.7–4.0)
MCH: 31.6 pg (ref 26.0–34.0)
MCHC: 33.9 g/dL (ref 30.0–36.0)
MCV: 93.1 fL (ref 80.0–100.0)
Monocytes Absolute: 0.5 10*3/uL (ref 0.1–1.0)
Monocytes Relative: 12 %
Neutro Abs: 2 10*3/uL (ref 1.7–7.7)
Neutrophils Relative %: 45 %
Platelet Count: 185 10*3/uL (ref 150–400)
RBC: 4.08 MIL/uL — ABNORMAL LOW (ref 4.22–5.81)
RDW: 12.4 % (ref 11.5–15.5)
WBC Count: 4.4 10*3/uL (ref 4.0–10.5)
nRBC: 0 % (ref 0.0–0.2)

## 2020-02-13 LAB — CMP (CANCER CENTER ONLY)
ALT: 19 U/L (ref 0–44)
AST: 24 U/L (ref 15–41)
Albumin: 4.8 g/dL (ref 3.5–5.0)
Alkaline Phosphatase: 47 U/L (ref 38–126)
Anion gap: 7 (ref 5–15)
BUN: 32 mg/dL — ABNORMAL HIGH (ref 8–23)
CO2: 26 mmol/L (ref 22–32)
Calcium: 9.8 mg/dL (ref 8.9–10.3)
Chloride: 107 mmol/L (ref 98–111)
Creatinine: 0.88 mg/dL (ref 0.61–1.24)
GFR, Est AFR Am: >60 mL/min (ref >60)
GFR, Estimated: >60 mL/min (ref >60)
Glucose, Bld: 105 mg/dL — ABNORMAL HIGH (ref 70–99)
Potassium: 4.6 mmol/L (ref 3.5–5.1)
Sodium: 140 mmol/L (ref 135–145)
Total Bilirubin: 0.5 mg/dL (ref 0.3–1.2)
Total Protein: 7.2 g/dL (ref 6.5–8.1)

## 2020-02-13 MED ORDER — LEUPROLIDE ACETATE (3 MONTH) 22.5 MG ~~LOC~~ KIT
22.5000 mg | PACK | Freq: Once | SUBCUTANEOUS | Status: AC
  Administered 2020-02-13: 12:00:00 22.5 mg via SUBCUTANEOUS
  Filled 2020-02-13: qty 22.5

## 2020-02-13 MED ORDER — TRAZODONE HCL 50 MG PO TABS: 50.0000 mg | ORAL_TABLET | Freq: Every evening | ORAL | 3 refills | Status: DC | PRN

## 2020-02-13 NOTE — Progress Notes (Signed)
Hematology and Oncology Follow Up Visit  Frederick Cruz [id number] July 05, 1956 64 y.o. 02/13/2020   Principle Diagnosis:   Metastatic prostate cancer-castrate responsive  Current Therapy:    Eligard 22.5 mg subcu every 3 months-next dose in 05/2020  Casodex 50 mg p.o. daily     Interim History:  Frederick Cruz is back for follow-up.  He is doing quite well right now.  We have ablated his testosterone.  Back in December his testosterone level was less than 3.  When he last saw him back in December his PSA was less than 0.1.  He has now retired.  He is enjoying retirement.  He does a lot of work outside.  He has had no problems with nausea or vomiting.  His main complaint has been lack of sleep.  I am sure this is because of testosterone deprivation.  I will go ahead and give him trazodone at 50 mg p.o. nightly as needed.  He has had no rashes.  He has had no problems with bowels or bladder.  He has had no leg swelling.  There is no cough or shortness of breath.  He is not yet had the coronavirus vaccine.   Overall, I would say his performance status is ECOG 0.    Medications:  Current Outpatient Medications:  Marland Kitchen  Melatonin 3 MG TABS, Take 3 mg by mouth at bedtime as needed. Take 2 tablets, total of 6 mg at bedtime as needed., Disp: , Rfl:  .  atorvastatin (LIPITOR) 10 MG tablet, Take 10 mg by mouth daily., Disp: , Rfl:  .  bicalutamide (CASODEX) 50 MG tablet, Take 1 tablet (50 mg total) by mouth daily., Disp: 30 tablet, Rfl: 12 .  Calcium-Magnesium 300-300 MG TABS, Take 1 tablet by mouth daily., Disp: , Rfl:  .  escitalopram (LEXAPRO) 5 MG tablet, Take 5 mg by mouth daily., Disp: , Rfl:  .  GLUTATHIONE PO, Take by mouth., Disp: , Rfl:  .  metoprolol succinate (TOPROL-XL) 25 MG 24 hr tablet, TAKE 1 TABLET BY MOUTH EVERY DAY, Disp: 90 tablet, Rfl: 2 .  Multiple Vitamins-Minerals (CENTRUM SILVER 50+MEN PO), Take 1 tablet by mouth daily., Disp: , Rfl:  .  Omega-3 Fatty Acids (FISH  OIL) 1000 MG CAPS, Take 1 capsule by mouth daily., Disp: , Rfl:  .  sildenafil (VIAGRA) 100 MG tablet, Take 100 mg by mouth daily as needed for erectile dysfunction., Disp: , Rfl:  .  Turmeric 400 MG CAPS, Take by mouth., Disp: , Rfl:  .  vitamin C (ASCORBIC ACID) 500 MG tablet, Take 500 mg by mouth daily., Disp: , Rfl:  .  vitamin E 400 UNIT capsule, Take 400 Units by mouth daily., Disp: , Rfl:   Allergies:  Allergies  Allergen Reactions  . Clonazepam Other (See Comments)    Double vision    Past Medical History, Surgical history, Social history, and Family History were reviewed and updated.  Review of Systems: Review of Systems  Constitutional: Negative.   HENT:  Negative.   Eyes: Negative.   Respiratory: Negative.   Cardiovascular: Negative.   Gastrointestinal: Negative.   Endocrine: Positive for hot flashes.  Genitourinary: Negative.    Musculoskeletal: Negative.   Skin: Negative.   Neurological: Negative.   Hematological: Negative.   Psychiatric/Behavioral: Negative.     Physical Exam:  weight is 219 lb (99.3 kg). His temporal temperature is 97.5 F (36.4 C) (abnormal). His blood pressure is 119/77 and his pulse is 57 (abnormal). His  respiration is 16 and oxygen saturation is 99%.   Wt Readings from Last 3 Encounters:  02/13/20 219 lb (99.3 kg)  11/14/19 213 lb (96.6 kg)  08/15/19 206 lb (93.4 kg)    Physical Exam Vitals reviewed.  HENT:     Head: Normocephalic and atraumatic.  Eyes:     Pupils: Pupils are equal, round, and reactive to light.  Cardiovascular:     Rate and Rhythm: Normal rate and regular rhythm.     Heart sounds: Normal heart sounds.  Pulmonary:     Effort: Pulmonary effort is normal.     Breath sounds: Normal breath sounds.  Abdominal:     General: Bowel sounds are normal.     Palpations: Abdomen is soft.  Musculoskeletal:        General: No tenderness or deformity. Normal range of motion.     Cervical back: Normal range of motion.    Lymphadenopathy:     Cervical: No cervical adenopathy.  Skin:    General: Skin is warm and dry.     Findings: No erythema or rash.  Neurological:     Mental Status: He is alert and oriented to person, place, and time.  Psychiatric:        Behavior: Behavior normal.        Thought Content: Thought content normal.        Judgment: Judgment normal.      Lab Results  Component Value Date   WBC 4.4 02/13/2020   HGB 12.9 (L) 02/13/2020   HCT 38.0 (L) 02/13/2020   MCV 93.1 02/13/2020   PLT 185 02/13/2020     Chemistry      Component Value Date/Time   NA 140 02/13/2020 0953   K 4.6 02/13/2020 0953   CL 107 02/13/2020 0953   CO2 26 02/13/2020 0953   BUN 32 (H) 02/13/2020 0953   CREATININE 0.88 02/13/2020 0953      Component Value Date/Time   CALCIUM 9.8 02/13/2020 0953   ALKPHOS 47 02/13/2020 0953   AST 24 02/13/2020 0953   ALT 19 02/13/2020 0953   BILITOT 0.5 02/13/2020 0953       Impression and Plan: Frederick Cruz is a 64 year old white male.  He has metastatic prostate cancer.  He clearly has oligo metastatic disease.  He has a PET scan that only shows 1 area of activity, that is in a external iliac lymph node.  I am glad that androgen deprivation has already work.  His PSA is now not detectable.  He has a castrate level testosterone.  I feel bad that he does have the side effects from low testosterone.  I probably would not repeat a another PET scan until 6 months.    He gets his Eligard today.  We will now have administer Eligard every 3 months.     Hopefully, his quality of life would not be compromised too much by the androgen deprivation.  Given that he does have the high grade prostate cancer with the high Gleason score when this was first resected, I know that he clearly is at risk for recurrence again.  I really do not think we have to give him chemotherapy at this point time.  I do still think he has enough metastatic disease that warrants the toxicity of  chemotherapy.  We will have him come back in 3 months.  He will get his Eligard at that time.  [name], MD 3/11/202110:57 AM

## 2020-02-13 NOTE — Patient Instructions (Signed)

## 2020-02-14 ENCOUNTER — Telehealth: Payer: Self-pay | Admitting: *Deleted

## 2020-02-14 ENCOUNTER — Telehealth: Payer: Self-pay | Admitting: Hematology & Oncology

## 2020-02-14 LAB — PSA, TOTAL AND FREE
PSA, Free Pct: UNDETERMINED %
PSA, Free: <0.02 ng/mL
Prostate Specific Ag, Serum: <0.1 ng/mL (ref 0.0–4.0)

## 2020-02-14 LAB — TESTOSTERONE: Testosterone: <3 ng/dL — ABNORMAL LOW (ref 264–916)

## 2020-02-14 NOTE — Telephone Encounter (Signed)
Called pt, unable to reach. LMOVM for pt with results

## 2020-02-14 NOTE — Telephone Encounter (Signed)
-----   Message from [name], MD sent at 02/14/2020  7:04 AM EST ----- Call - the PSA is still not detectable!!  Frederick Cruz

## 2020-02-14 NOTE — Telephone Encounter (Signed)
Appointments scheduled calendar/letter mailed per 3/11 los

## 2020-03-06 ENCOUNTER — Other Ambulatory Visit: Payer: Self-pay | Admitting: Hematology & Oncology

## 2020-05-15 ENCOUNTER — Other Ambulatory Visit: Payer: Self-pay

## 2020-05-15 ENCOUNTER — Encounter: Payer: Self-pay | Admitting: Hematology & Oncology

## 2020-05-15 ENCOUNTER — Inpatient Hospital Stay (HOSPITAL_BASED_OUTPATIENT_CLINIC_OR_DEPARTMENT_OTHER): Payer: Medicare Other | Admitting: Hematology & Oncology

## 2020-05-15 ENCOUNTER — Inpatient Hospital Stay: Payer: Medicare Other | Attending: Hematology & Oncology

## 2020-05-15 ENCOUNTER — Inpatient Hospital Stay: Payer: Medicare Other

## 2020-05-15 VITALS — BP 105/70 | HR 57 | Temp 96.9°F | Resp 18 | Ht 69.0 in | Wt 215.8 lb

## 2020-05-15 DIAGNOSIS — C61 Malignant neoplasm of prostate: Secondary | ICD-10-CM

## 2020-05-15 DIAGNOSIS — C775 Secondary and unspecified malignant neoplasm of intrapelvic lymph nodes: Secondary | ICD-10-CM

## 2020-05-15 DIAGNOSIS — Z5111 Encounter for antineoplastic chemotherapy: Secondary | ICD-10-CM | POA: Diagnosis present

## 2020-05-15 LAB — CMP (CANCER CENTER ONLY)
ALT: 20 U/L (ref 0–44)
AST: 26 U/L (ref 15–41)
Albumin: 4.6 g/dL (ref 3.5–5.0)
Alkaline Phosphatase: 45 U/L (ref 38–126)
Anion gap: 6 (ref 5–15)
BUN: 22 mg/dL (ref 8–23)
CO2: 29 mmol/L (ref 22–32)
Calcium: 9.9 mg/dL (ref 8.9–10.3)
Chloride: 108 mmol/L (ref 98–111)
Creatinine: 1.02 mg/dL (ref 0.61–1.24)
GFR, Est AFR Am: >60 mL/min (ref >60)
GFR, Estimated: >60 mL/min (ref >60)
Glucose, Bld: 98 mg/dL (ref 70–99)
Potassium: 5 mmol/L (ref 3.5–5.1)
Sodium: 143 mmol/L (ref 135–145)
Total Bilirubin: 0.5 mg/dL (ref 0.3–1.2)
Total Protein: 7.2 g/dL (ref 6.5–8.1)

## 2020-05-15 LAB — CBC WITH DIFFERENTIAL (CANCER CENTER ONLY)
Abs Immature Granulocytes: 0.01 10*3/uL (ref 0.00–0.07)
Basophils Absolute: 0.1 10*3/uL (ref 0.0–0.1)
Basophils Relative: 1 %
Eosinophils Absolute: 0.3 10*3/uL (ref 0.0–0.5)
Eosinophils Relative: 7 %
HCT: 39 % (ref 39.0–52.0)
Hemoglobin: 13.2 g/dL (ref 13.0–17.0)
Immature Granulocytes: 0 %
Lymphocytes Relative: 37 %
Lymphs Abs: 1.6 10*3/uL (ref 0.7–4.0)
MCH: 32 pg (ref 26.0–34.0)
MCHC: 33.8 g/dL (ref 30.0–36.0)
MCV: 94.7 fL (ref 80.0–100.0)
Monocytes Absolute: 0.4 10*3/uL (ref 0.1–1.0)
Monocytes Relative: 9 %
Neutro Abs: 2 10*3/uL (ref 1.7–7.7)
Neutrophils Relative %: 46 %
Platelet Count: 175 10*3/uL (ref 150–400)
RBC: 4.12 MIL/uL — ABNORMAL LOW (ref 4.22–5.81)
RDW: 11.9 % (ref 11.5–15.5)
WBC Count: 4.3 10*3/uL (ref 4.0–10.5)
nRBC: 0 % (ref 0.0–0.2)

## 2020-05-15 MED ORDER — LEUPROLIDE ACETATE (3 MONTH) 22.5 MG ~~LOC~~ KIT
22.5000 mg | PACK | Freq: Once | SUBCUTANEOUS | Status: AC
  Administered 2020-05-15: 22.5 mg via SUBCUTANEOUS
  Filled 2020-05-15: qty 22.5

## 2020-05-15 NOTE — Progress Notes (Signed)
Hematology and Oncology Follow Up Visit  Frederick Cruz [id number] 1956/09/19 64 y.o. 05/15/2020   Principle Diagnosis:   Metastatic prostate cancer-castrate responsive  Current Therapy:    Eligard 22.5 mg subcu every 3 months-next dose in 05/2020  Casodex 50 mg p.o. daily     Interim History:  Mr. Frederick Cruz is back for follow-up.  He is doing quite well right now.  He looks quite good.  He has had no complaints.  He has wife were down in [place] for couple weeks camping.  They enjoyed themselves.  Then they came back up through [place].  He might go up to [place] sometime this summer to go camping.  His last PSA was less than 0.01.  His last testosterone was 3.  He has had no problems with bowels or bladder.  He has had no pain.  He has had no nausea or vomiting.  He has had no fever.  He has not yet had the coronavirus vaccine.     Overall, I would say his performance status is ECOG 0.    Medications:  Current Outpatient Medications:  .  atorvastatin (LIPITOR) 10 MG tablet, Take 10 mg by mouth daily., Disp: , Rfl:  .  bicalutamide (CASODEX) 50 MG tablet, Take 1 tablet (50 mg total) by mouth daily., Disp: 30 tablet, Rfl: 12 .  Calcium-Magnesium 300-300 MG TABS, Take 1 tablet by mouth daily., Disp: , Rfl:  .  escitalopram (LEXAPRO) 5 MG tablet, Take 5 mg by mouth daily., Disp: , Rfl:  .  GLUTATHIONE PO, Take by mouth., Disp: , Rfl:  .  Melatonin 3 MG TABS, Take 3 mg by mouth at bedtime as needed. Take 2 tablets, total of 6 mg at bedtime as needed., Disp: , Rfl:  .  metoprolol succinate (TOPROL-XL) 25 MG 24 hr tablet, TAKE 1 TABLET BY MOUTH EVERY DAY, Disp: 90 tablet, Rfl: 2 .  Multiple Vitamins-Minerals (CENTRUM SILVER 50+MEN PO), Take 1 tablet by mouth daily., Disp: , Rfl:  .  Omega-3 Fatty Acids (FISH OIL) 1000 MG CAPS, Take 1 capsule by mouth daily., Disp: , Rfl:  .  sildenafil (VIAGRA) 100 MG tablet, Take 100 mg by mouth daily as needed for erectile dysfunction., Disp: ,  Rfl:  .  traZODone (DESYREL) 50 MG tablet, TAKE 1 TABLET (50 MG TOTAL) BY MOUTH AT BEDTIME AS NEEDED FOR SLEEP., Disp: 90 tablet, Rfl: 2 .  Turmeric 400 MG CAPS, Take by mouth., Disp: , Rfl:  .  vitamin C (ASCORBIC ACID) 500 MG tablet, Take 500 mg by mouth daily., Disp: , Rfl:  .  vitamin E 400 UNIT capsule, Take 400 Units by mouth daily., Disp: , Rfl:   Allergies:  Allergies  Allergen Reactions  . Clonazepam Other (See Comments)    Double vision    Past Medical History, Surgical history, Social history, and Family History were reviewed and updated.  Review of Systems: Review of Systems  Constitutional: Negative.   HENT:  Negative.   Eyes: Negative.   Respiratory: Negative.   Cardiovascular: Negative.   Gastrointestinal: Negative.   Endocrine: Positive for hot flashes.  Genitourinary: Negative.    Musculoskeletal: Negative.   Skin: Negative.   Neurological: Negative.   Hematological: Negative.   Psychiatric/Behavioral: Negative.     Physical Exam:  vitals were not taken for this visit.   Wt Readings from Last 3 Encounters:  02/13/20 219 lb (99.3 kg)  11/14/19 213 lb (96.6 kg)  08/15/19 206 lb (93.4 kg)  Physical Exam Vitals reviewed.  HENT:     Head: Normocephalic and atraumatic.  Eyes:     Pupils: Pupils are equal, round, and reactive to light.  Cardiovascular:     Rate and Rhythm: Normal rate and regular rhythm.     Heart sounds: Normal heart sounds.  Pulmonary:     Effort: Pulmonary effort is normal.     Breath sounds: Normal breath sounds.  Abdominal:     General: Bowel sounds are normal.     Palpations: Abdomen is soft.  Musculoskeletal:        General: No tenderness or deformity. Normal range of motion.     Cervical back: Normal range of motion.  Lymphadenopathy:     Cervical: No cervical adenopathy.  Skin:    General: Skin is warm and dry.     Findings: No erythema or rash.  Neurological:     Mental Status: He is alert and oriented to person,  place, and time.  Psychiatric:        Behavior: Behavior normal.        Thought Content: Thought content normal.        Judgment: Judgment normal.      Lab Results  Component Value Date   WBC 4.3 05/15/2020   HGB 13.2 05/15/2020   HCT 39.0 05/15/2020   MCV 94.7 05/15/2020   PLT 175 05/15/2020     Chemistry      Component Value Date/Time   NA 140 02/13/2020 0953   K 4.6 02/13/2020 0953   CL 107 02/13/2020 0953   CO2 26 02/13/2020 0953   BUN 32 (H) 02/13/2020 0953   CREATININE 0.88 02/13/2020 0953      Component Value Date/Time   CALCIUM 9.8 02/13/2020 0953   ALKPHOS 47 02/13/2020 0953   AST 24 02/13/2020 0953   ALT 19 02/13/2020 0953   BILITOT 0.5 02/13/2020 0953       Impression and Plan: Mr. Frederick Cruz is a 64 year old white male.  He has metastatic prostate cancer.  He clearly has oligo metastatic disease.  He has a PET scan that only shows 1 area of activity, that is in a external iliac lymph node.  His last PET scan back in December showed that the lymph node was no longer metabolic.  I am glad that androgen deprivation has already work.  His PSA is now not detectable.  He has a castrate level testosterone.  I feel bad that he does have the side effects from low testosterone.  I probably would not repeat a another PET scan until 6 months.    He gets his Eligard today.  We will now have administer Eligard every 3 months.     Hopefully, his quality of life would not be compromised too much by the androgen deprivation.  Given that he does have the high grade prostate cancer with the high Gleason score when this was first resected, I know that he clearly is at risk for recurrence again.  I really do not think we have to give him chemotherapy at this point time.  I do still think he has enough metastatic disease that warrants the toxicity of chemotherapy.  We will have him come back in 3 months.  He will get his Eligard at that time.  [name],  MD 6/11/20219:41 AM

## 2020-05-15 NOTE — Patient Instructions (Signed)

## 2020-05-16 LAB — TESTOSTERONE: Testosterone: <3 ng/dL — ABNORMAL LOW (ref 264–916)

## 2020-05-16 LAB — PSA, TOTAL AND FREE
PSA, Free Pct: UNDETERMINED %
PSA, Free: <0.02 ng/mL
Prostate Specific Ag, Serum: <0.1 ng/mL (ref 0.0–4.0)

## 2020-07-06 ENCOUNTER — Other Ambulatory Visit: Payer: Self-pay | Admitting: Cardiology

## 2020-07-06 ENCOUNTER — Other Ambulatory Visit: Payer: Self-pay | Admitting: Hematology & Oncology

## 2020-07-06 DIAGNOSIS — I493 Ventricular premature depolarization: Secondary | ICD-10-CM

## 2020-07-22 ENCOUNTER — Other Ambulatory Visit: Payer: Self-pay | Admitting: *Deleted

## 2020-07-22 DIAGNOSIS — N5231 Erectile dysfunction following radical prostatectomy: Secondary | ICD-10-CM

## 2020-07-22 DIAGNOSIS — C61 Malignant neoplasm of prostate: Secondary | ICD-10-CM

## 2020-07-22 MED ORDER — SILDENAFIL CITRATE 100 MG PO TABS: 100.0000 mg | ORAL_TABLET | Freq: Every day | ORAL | 3 refills | Status: DC | PRN

## 2020-07-30 ENCOUNTER — Other Ambulatory Visit: Payer: Self-pay | Admitting: Cardiology

## 2020-07-30 DIAGNOSIS — I493 Ventricular premature depolarization: Secondary | ICD-10-CM

## 2020-08-05 ENCOUNTER — Other Ambulatory Visit: Payer: Self-pay | Admitting: Cardiology

## 2020-08-05 DIAGNOSIS — I493 Ventricular premature depolarization: Secondary | ICD-10-CM

## 2020-08-14 ENCOUNTER — Inpatient Hospital Stay: Payer: Medicare Other

## 2020-08-14 ENCOUNTER — Other Ambulatory Visit: Payer: Self-pay

## 2020-08-14 ENCOUNTER — Inpatient Hospital Stay: Payer: Medicare Other | Attending: Hematology & Oncology | Admitting: Hematology & Oncology

## 2020-08-14 VITALS — BP 131/78 | HR 58 | Temp 98.1°F | Resp 18 | Wt 222.5 lb

## 2020-08-14 DIAGNOSIS — C775 Secondary and unspecified malignant neoplasm of intrapelvic lymph nodes: Secondary | ICD-10-CM | POA: Diagnosis not present

## 2020-08-14 DIAGNOSIS — Z5111 Encounter for antineoplastic chemotherapy: Secondary | ICD-10-CM | POA: Diagnosis present

## 2020-08-14 DIAGNOSIS — Z79899 Other long term (current) drug therapy: Secondary | ICD-10-CM | POA: Diagnosis not present

## 2020-08-14 DIAGNOSIS — C61 Malignant neoplasm of prostate: Secondary | ICD-10-CM

## 2020-08-14 LAB — CBC WITH DIFFERENTIAL (CANCER CENTER ONLY)
Abs Immature Granulocytes: 0.01 10*3/uL (ref 0.00–0.07)
Basophils Absolute: 0.1 10*3/uL (ref 0.0–0.1)
Basophils Relative: 1 %
Eosinophils Absolute: 0.3 10*3/uL (ref 0.0–0.5)
Eosinophils Relative: 6 %
HCT: 38.1 % — ABNORMAL LOW (ref 39.0–52.0)
Hemoglobin: 13 g/dL (ref 13.0–17.0)
Immature Granulocytes: 0 %
Lymphocytes Relative: 32 %
Lymphs Abs: 1.5 10*3/uL (ref 0.7–4.0)
MCH: 32.1 pg (ref 26.0–34.0)
MCHC: 34.1 g/dL (ref 30.0–36.0)
MCV: 94.1 fL (ref 80.0–100.0)
Monocytes Absolute: 0.5 10*3/uL (ref 0.1–1.0)
Monocytes Relative: 10 %
Neutro Abs: 2.5 10*3/uL (ref 1.7–7.7)
Neutrophils Relative %: 51 %
Platelet Count: 167 10*3/uL (ref 150–400)
RBC: 4.05 MIL/uL — ABNORMAL LOW (ref 4.22–5.81)
RDW: 12 % (ref 11.5–15.5)
WBC Count: 4.8 10*3/uL (ref 4.0–10.5)
nRBC: 0 % (ref 0.0–0.2)

## 2020-08-14 LAB — CMP (CANCER CENTER ONLY)
ALT: 23 U/L (ref 0–44)
AST: 30 U/L (ref 15–41)
Albumin: 4.3 g/dL (ref 3.5–5.0)
Alkaline Phosphatase: 47 U/L (ref 38–126)
Anion gap: 6 (ref 5–15)
BUN: 28 mg/dL — ABNORMAL HIGH (ref 8–23)
CO2: 30 mmol/L (ref 22–32)
Calcium: 9.5 mg/dL (ref 8.9–10.3)
Chloride: 105 mmol/L (ref 98–111)
Creatinine: 1.07 mg/dL (ref 0.61–1.24)
GFR, Est AFR Am: >60 mL/min (ref >60)
GFR, Estimated: >60 mL/min (ref >60)
Glucose, Bld: 84 mg/dL (ref 70–99)
Potassium: 4.6 mmol/L (ref 3.5–5.1)
Sodium: 141 mmol/L (ref 135–145)
Total Bilirubin: 0.5 mg/dL (ref 0.3–1.2)
Total Protein: 6.9 g/dL (ref 6.5–8.1)

## 2020-08-14 MED ORDER — LEUPROLIDE ACETATE (3 MONTH) 22.5 MG ~~LOC~~ KIT
22.5000 mg | PACK | Freq: Once | SUBCUTANEOUS | Status: AC
  Administered 2020-08-14: 22.5 mg via SUBCUTANEOUS
  Filled 2020-08-14: qty 22.5

## 2020-08-14 NOTE — Progress Notes (Signed)
Hematology and Oncology Follow Up Visit  NIVEK POWLEY [id number] 04-27-1956 64 y.o. 08/14/2020   Principle Diagnosis:   Metastatic prostate cancer-castrate responsive  Current Therapy:    Eligard 22.5 mg subcu every 3 months-next dose in 11/2020  Casodex 50 mg p.o. daily     Interim History:  Mr. Vora is back for follow-up.  He is doing quite well right now.  He had a very nice summer.  He and his wife went camping.  They went down to [place].  They went to [place].  They really have done quite a bit of traveling for camping.  He had a very nice Labor Day weekend with his grandchildren.  His last PSA he looks quite good.  Level is less than 0.1.  His testosterone level is quite low.  He does have some hot flashes.  He has had no problems with bony pain.    He has had no problems with bowels or bladder.  He has had no pain.  He has had no nausea or vomiting.  He has had no fever.  Overall, I would say his performance status is ECOG 0.    Medications:  Current Outpatient Medications:  .  atorvastatin (LIPITOR) 10 MG tablet, Take 10 mg by mouth daily., Disp: , Rfl:  .  atorvastatin (LIPITOR) 20 MG tablet, Take 20 mg by mouth daily., Disp: , Rfl:  .  bicalutamide (CASODEX) 50 MG tablet, TAKE 1 TABLET BY MOUTH EVERY DAY, Disp: 90 tablet, Rfl: 4 .  Calcium-Magnesium 300-300 MG TABS, Take 1 tablet by mouth daily., Disp: , Rfl:  .  Calcium-Magnesium 300-300 MG TABS, Take by mouth., Disp: , Rfl:  .  escitalopram (LEXAPRO) 5 MG tablet, Take 5 mg by mouth daily., Disp: , Rfl:  .  GLUTATHIONE PO, Take by mouth., Disp: , Rfl:  .  Melatonin 3 MG TABS, Take 3 mg by mouth at bedtime as needed. Take 2 tablets, total of 6 mg at bedtime as needed., Disp: , Rfl:  .  Melatonin 5 MG CAPS, Take by mouth., Disp: , Rfl:  .  metoprolol succinate (TOPROL-XL) 25 MG 24 hr tablet, TAKE 1 TABLET (25 MG TOTAL) BY MOUTH DAILY. NEED OFFICE VISIT, Disp: 30 tablet, Rfl: 0 .  Multiple Vitamins-Minerals  (CENTRUM SILVER 50+MEN PO), Take 1 tablet by mouth daily., Disp: , Rfl:  .  Omega-3 Fatty Acids (FISH OIL) 1000 MG CAPS, Take 1 capsule by mouth daily., Disp: , Rfl:  .  sildenafil (VIAGRA) 100 MG tablet, Take 1 tablet (100 mg total) by mouth daily as needed for erectile dysfunction., Disp: 10 tablet, Rfl: 3 .  traZODone (DESYREL) 50 MG tablet, TAKE 1 TABLET (50 MG TOTAL) BY MOUTH AT BEDTIME AS NEEDED FOR SLEEP., Disp: 90 tablet, Rfl: 2 .  Turmeric 400 MG CAPS, Take by mouth., Disp: , Rfl:  .  TURMERIC PO, Take by mouth., Disp: , Rfl:  .  vitamin C (ASCORBIC ACID) 500 MG tablet, Take 500 mg by mouth daily., Disp: , Rfl:  .  vitamin E 400 UNIT capsule, Take 400 Units by mouth daily., Disp: , Rfl:  .  zinc gluconate 50 MG tablet, Take by mouth., Disp: , Rfl:   Allergies:  Allergies  Allergen Reactions  . Clonazepam Other (See Comments)    Double vision    Past Medical History, Surgical history, Social history, and Family History were reviewed and updated.  Review of Systems: Review of Systems  Constitutional: Negative.   HENT:  Negative.   Eyes: Negative.   Respiratory: Negative.   Cardiovascular: Negative.   Gastrointestinal: Negative.   Endocrine: Positive for hot flashes.  Genitourinary: Negative.    Musculoskeletal: Negative.   Skin: Negative.   Neurological: Negative.   Hematological: Negative.   Psychiatric/Behavioral: Negative.     Physical Exam:  weight is 222 lb 8 oz (100.9 kg). His oral temperature is 98.1 F (36.7 C). His blood pressure is 131/78 and his pulse is 58 (abnormal). His respiration is 18 and oxygen saturation is 99%.   Wt Readings from Last 3 Encounters:  08/14/20 222 lb 8 oz (100.9 kg)  05/15/20 215 lb 12.8 oz (97.9 kg)  02/13/20 219 lb (99.3 kg)    Physical Exam Vitals reviewed.  HENT:     Head: Normocephalic and atraumatic.  Eyes:     Pupils: Pupils are equal, round, and reactive to light.  Cardiovascular:     Rate and Rhythm: Normal rate  and regular rhythm.     Heart sounds: Normal heart sounds.  Pulmonary:     Effort: Pulmonary effort is normal.     Breath sounds: Normal breath sounds.  Abdominal:     General: Bowel sounds are normal.     Palpations: Abdomen is soft.  Musculoskeletal:        General: No tenderness or deformity. Normal range of motion.     Cervical back: Normal range of motion.  Lymphadenopathy:     Cervical: No cervical adenopathy.  Skin:    General: Skin is warm and dry.     Findings: No erythema or rash.  Neurological:     Mental Status: He is alert and oriented to person, place, and time.  Psychiatric:        Behavior: Behavior normal.        Thought Content: Thought content normal.        Judgment: Judgment normal.      Lab Results  Component Value Date   WBC 4.8 08/14/2020   HGB 13.0 08/14/2020   HCT 38.1 (L) 08/14/2020   MCV 94.1 08/14/2020   PLT 167 08/14/2020     Chemistry      Component Value Date/Time   NA 143 05/15/2020 0928   K 5.0 05/15/2020 0928   CL 108 05/15/2020 0928   CO2 29 05/15/2020 0928   BUN 22 05/15/2020 0928   CREATININE 1.02 05/15/2020 0928      Component Value Date/Time   CALCIUM 9.9 05/15/2020 0928   ALKPHOS 45 05/15/2020 0928   AST 26 05/15/2020 0928   ALT 20 05/15/2020 0928   BILITOT 0.5 05/15/2020 0928       Impression and Plan: Mr. Knight is a 64 year old white male.  He has metastatic prostate cancer.  He clearly has oligo metastatic disease.  I think would be wise for [place] to go ahead and get another PET scan on him.  His last one was done back in December.  I would have to think that given the PSA being very low, that the PET scan should really show little, if any disease that is active.  His quality life is doing so well.  He is so active.  I am glad that he is able to enjoy what he would like.  We will have him come back in 3 months.  He will get his Eligard at that time.  [name], MD 9/10/20219:53 AM

## 2020-08-15 LAB — PSA, TOTAL AND FREE
PSA, Free Pct: UNDETERMINED %
PSA, Free: <0.01 ng/mL
Prostate Specific Ag, Serum: <0.1 ng/mL (ref 0.0–4.0)

## 2020-08-17 ENCOUNTER — Telehealth: Payer: Self-pay | Admitting: *Deleted

## 2020-08-17 LAB — TESTOSTERONE: Testosterone: <3 ng/dL — ABNORMAL LOW (ref 264–916)

## 2020-08-17 NOTE — Telephone Encounter (Signed)
-----   Message from [name], MD sent at 08/17/2020  6:46 AM EDT ----- Call - the PSA is still not detectable!!  [name]

## 2020-08-17 NOTE — Telephone Encounter (Signed)
Patient's wife notified per order of Dr. Marin Olp that "the PSA is still not detectable!!  Frederick Cruz"  Pt.'s wife appreciative of call and has no questions at this time.

## 2020-09-14 ENCOUNTER — Encounter: Payer: Self-pay | Admitting: *Deleted

## 2020-09-14 NOTE — Progress Notes (Signed)
[place], patient's wife, calling to see if PET scan was authorized and could be scheduled. PET scan authorized and scheduled for 11/10/2020.  Reviewed appointment time, date and location with [facility]. She knows the patient needs to be there at 7:30 and that he is NPO after midnight.   Oncology Nurse Navigator Documentation  Oncology Nurse Navigator Flowsheets 09/14/2020  Confirmed Diagnosis Date -  Diagnosis Status -  Navigator Follow Up Date: -  Navigator Follow Up Reason: -  Navigation Complete Date: -  Post Navigation: Continue to Follow Patient? 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

## 2020-11-10 ENCOUNTER — Ambulatory Visit (HOSPITAL_COMMUNITY): Payer: Medicare Other

## 2020-11-11 ENCOUNTER — Telehealth: Payer: Self-pay

## 2020-11-11 ENCOUNTER — Encounter: Payer: Self-pay | Admitting: *Deleted

## 2020-11-11 NOTE — Progress Notes (Signed)
Patient's wife, Lovey Newcomer, called yesterday concerned that patient's PET scan was cancelled last week and they had not yet called to reschedule. Xaden currently has an appointment with this office this Friday.  Spoke to scheduling. Patient's PET scan was incorrectly scheduled which led to the cancellation. When the appointment was cancelled, the referral was closed and the need for rescheduling wasn't seen. They will reach out to schedule.  Called and spoke to [place] this morning. Told her that the appointment with Dr Marin Olp would be rescheduled to after the PET scan so that results could be reviewed. She understood. Message sent to scheduling.   Oncology Nurse Navigator Documentation  Oncology Nurse Navigator Flowsheets 11/11/2020  Confirmed Diagnosis Date -  Diagnosis Status -  Navigator Follow Up Date: -  Navigator Follow Up Reason: -  Navigation Complete Date: -  Post Navigation: Continue to Follow Patient? 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

## 2020-11-11 NOTE — Telephone Encounter (Signed)
S/W PTS WIFE PER INBASKET MESSAGE TO R/S APPTS TO AFTER THE PET ON 12/16, APPTS MOVED.Marland Kitchen. aom

## 2020-11-13 ENCOUNTER — Other Ambulatory Visit: Payer: Medicare Other

## 2020-11-13 ENCOUNTER — Ambulatory Visit: Payer: Medicare Other | Admitting: Hematology & Oncology

## 2020-11-13 ENCOUNTER — Ambulatory Visit: Payer: Medicare Other

## 2020-11-19 ENCOUNTER — Ambulatory Visit (HOSPITAL_COMMUNITY)
Admission: RE | Admit: 2020-11-19 | Discharge: 2020-11-19 | Disposition: A | Discharge status: Home / Self Care | Payer: Medicare Other | Source: Ambulatory Visit | Attending: Hematology & Oncology | Admitting: Hematology & Oncology

## 2020-11-19 ENCOUNTER — Other Ambulatory Visit: Payer: Self-pay

## 2020-11-19 DIAGNOSIS — C775 Secondary and unspecified malignant neoplasm of intrapelvic lymph nodes: Secondary | ICD-10-CM | POA: Insufficient documentation

## 2020-11-19 DIAGNOSIS — C61 Malignant neoplasm of prostate: Secondary | ICD-10-CM | POA: Insufficient documentation

## 2020-11-19 MED ORDER — AXUMIN (FLUCICLOVINE F 18) INJECTION
10.8100 | Freq: Once | INTRAVENOUS | Status: AC
  Administered 2020-11-19: 10.81 via INTRAVENOUS

## 2020-11-20 ENCOUNTER — Telehealth: Payer: Self-pay | Admitting: *Deleted

## 2020-11-20 NOTE — Telephone Encounter (Signed)
As noted below by Dr. Marin Olp, I informed the patient that the scan does NOT show any obvious active prostate cancer. He verbalized understanding.

## 2020-11-20 NOTE — Telephone Encounter (Signed)
-----   Message from [name], MD sent at 11/20/2020 11:34 AM EST ----- Call - the scan does NOT show any obvious active prostate cancer!!!   Great way to end the year!!!  Merry Christmas!!  [name]

## 2020-11-23 ENCOUNTER — Inpatient Hospital Stay: Payer: Medicare Other | Attending: Hematology & Oncology

## 2020-11-23 ENCOUNTER — Inpatient Hospital Stay (HOSPITAL_BASED_OUTPATIENT_CLINIC_OR_DEPARTMENT_OTHER): Payer: Medicare Other | Admitting: Hematology & Oncology

## 2020-11-23 ENCOUNTER — Inpatient Hospital Stay: Payer: Medicare Other

## 2020-11-23 ENCOUNTER — Other Ambulatory Visit: Payer: Self-pay

## 2020-11-23 ENCOUNTER — Encounter: Payer: Self-pay | Admitting: Hematology & Oncology

## 2020-11-23 VITALS — BP 132/82 | HR 54 | Temp 98.3°F | Resp 17 | Wt 230.0 lb

## 2020-11-23 DIAGNOSIS — C61 Malignant neoplasm of prostate: Secondary | ICD-10-CM

## 2020-11-23 DIAGNOSIS — Z5111 Encounter for antineoplastic chemotherapy: Secondary | ICD-10-CM | POA: Diagnosis present

## 2020-11-23 DIAGNOSIS — C775 Secondary and unspecified malignant neoplasm of intrapelvic lymph nodes: Secondary | ICD-10-CM | POA: Diagnosis not present

## 2020-11-23 LAB — CBC WITH DIFFERENTIAL (CANCER CENTER ONLY)
Abs Immature Granulocytes: 0.02 10*3/uL (ref 0.00–0.07)
Basophils Absolute: 0.1 10*3/uL (ref 0.0–0.1)
Basophils Relative: 1 %
Eosinophils Absolute: 0.2 10*3/uL (ref 0.0–0.5)
Eosinophils Relative: 3 %
HCT: 39.6 % (ref 39.0–52.0)
Hemoglobin: 13.4 g/dL (ref 13.0–17.0)
Immature Granulocytes: 0 %
Lymphocytes Relative: 28 %
Lymphs Abs: 1.7 10*3/uL (ref 0.7–4.0)
MCH: 32.3 pg (ref 26.0–34.0)
MCHC: 33.8 g/dL (ref 30.0–36.0)
MCV: 95.4 fL (ref 80.0–100.0)
Monocytes Absolute: 0.5 10*3/uL (ref 0.1–1.0)
Monocytes Relative: 8 %
Neutro Abs: 3.6 10*3/uL (ref 1.7–7.7)
Neutrophils Relative %: 60 %
Platelet Count: 187 10*3/uL (ref 150–400)
RBC: 4.15 MIL/uL — ABNORMAL LOW (ref 4.22–5.81)
RDW: 12 % (ref 11.5–15.5)
WBC Count: 6 10*3/uL (ref 4.0–10.5)
nRBC: 0 % (ref 0.0–0.2)

## 2020-11-23 LAB — CMP (CANCER CENTER ONLY)
ALT: 18 U/L (ref 0–44)
AST: 22 U/L (ref 15–41)
Albumin: 4.5 g/dL (ref 3.5–5.0)
Alkaline Phosphatase: 51 U/L (ref 38–126)
Anion gap: 8 (ref 5–15)
BUN: 26 mg/dL — ABNORMAL HIGH (ref 8–23)
CO2: 30 mmol/L (ref 22–32)
Calcium: 9.8 mg/dL (ref 8.9–10.3)
Chloride: 105 mmol/L (ref 98–111)
Creatinine: 0.96 mg/dL (ref 0.61–1.24)
GFR, Estimated: >60 mL/min (ref >60)
Glucose, Bld: 107 mg/dL — ABNORMAL HIGH (ref 70–99)
Potassium: 4.8 mmol/L (ref 3.5–5.1)
Sodium: 143 mmol/L (ref 135–145)
Total Bilirubin: 0.4 mg/dL (ref 0.3–1.2)
Total Protein: 7.2 g/dL (ref 6.5–8.1)

## 2020-11-23 MED ORDER — LEUPROLIDE ACETATE (3 MONTH) 22.5 MG ~~LOC~~ KIT
22.5000 mg | PACK | Freq: Once | SUBCUTANEOUS | Status: AC
  Administered 2020-11-23: 22.5 mg via SUBCUTANEOUS
  Filled 2020-11-23: qty 22.5

## 2020-11-23 NOTE — Patient Instructions (Signed)

## 2020-11-23 NOTE — Progress Notes (Signed)
Hematology and Oncology Follow Up Visit  Frederick Cruz [id number] Mar 10, 1956 64 y.o. 11/23/2020   Principle Diagnosis:   Metastatic prostate cancer-castrate responsive  Current Therapy:    Eligard 22.5 mg subcu every 3 months-next dose in 02/2021  Casodex 50 mg p.o. daily     Interim History:  Frederick Cruz is back for follow-up.  He is doing quite well right now.  We last saw him back in September.  Had a very nice Thanksgiving.  It will be required Christmas for he and his wife.  He and his wife will be down in [place] in April.  They will be camping in there then coming back up.  We did a PET scan on him.  This was last week.  The PET scan did not show any evidence of bony metastasis.  His last PSA was less than 0.1.  His testosterone less than 3.  He does have hot flashes from the low testosterone.  He is still trying to stay active.  Overall, his appetite is good.  He has had no nausea or vomiting.  He has had no change in bowel or bladder habits.  There is been no rashes.  He said no leg swelling.  Overall, his performance status is ECOG 1.     Medications:  Current Outpatient Medications:  .  atorvastatin (LIPITOR) 10 MG tablet, Take 10 mg by mouth daily., Disp: , Rfl:  .  atorvastatin (LIPITOR) 20 MG tablet, Take 20 mg by mouth daily., Disp: , Rfl:  .  bicalutamide (CASODEX) 50 MG tablet, TAKE 1 TABLET BY MOUTH EVERY DAY, Disp: 90 tablet, Rfl: 4 .  Calcium-Magnesium 300-300 MG TABS, Take 1 tablet by mouth daily., Disp: , Rfl:  .  Calcium-Magnesium 300-300 MG TABS, Take by mouth., Disp: , Rfl:  .  escitalopram (LEXAPRO) 5 MG tablet, Take 5 mg by mouth daily., Disp: , Rfl:  .  GLUTATHIONE PO, Take by mouth., Disp: , Rfl:  .  Melatonin 3 MG TABS, Take 3 mg by mouth at bedtime as needed. Take 2 tablets, total of 6 mg at bedtime as needed., Disp: , Rfl:  .  Melatonin 5 MG CAPS, Take by mouth., Disp: , Rfl:  .  metoprolol succinate (TOPROL-XL) 25 MG 24 hr tablet,  TAKE 1 TABLET (25 MG TOTAL) BY MOUTH DAILY. NEED OFFICE VISIT, Disp: 30 tablet, Rfl: 0 .  Multiple Vitamins-Minerals (CENTRUM SILVER 50+MEN PO), Take 1 tablet by mouth daily., Disp: , Rfl:  .  Omega-3 Fatty Acids (FISH OIL) 1000 MG CAPS, Take 1 capsule by mouth daily., Disp: , Rfl:  .  sildenafil (VIAGRA) 100 MG tablet, Take 1 tablet (100 mg total) by mouth daily as needed for erectile dysfunction., Disp: 10 tablet, Rfl: 3 .  traZODone (DESYREL) 50 MG tablet, TAKE 1 TABLET (50 MG TOTAL) BY MOUTH AT BEDTIME AS NEEDED FOR SLEEP., Disp: 90 tablet, Rfl: 2 .  Turmeric 400 MG CAPS, Take by mouth., Disp: , Rfl:  .  TURMERIC PO, Take by mouth., Disp: , Rfl:  .  vitamin C (ASCORBIC ACID) 500 MG tablet, Take 500 mg by mouth daily., Disp: , Rfl:  .  vitamin E 400 UNIT capsule, Take 400 Units by mouth daily., Disp: , Rfl:  .  zinc gluconate 50 MG tablet, Take by mouth., Disp: , Rfl:   Allergies:  Allergies  Allergen Reactions  . Clonazepam Other (See Comments)    Double vision    Past Medical History, Surgical history, Social  history, and Family History were reviewed and updated.  Review of Systems: Review of Systems  Constitutional: Negative.   HENT:  Negative.   Eyes: Negative.   Respiratory: Negative.   Cardiovascular: Negative.   Gastrointestinal: Negative.   Endocrine: Positive for hot flashes.  Genitourinary: Negative.    Musculoskeletal: Negative.   Skin: Negative.   Neurological: Negative.   Hematological: Negative.   Psychiatric/Behavioral: Negative.     Physical Exam:  weight is 230 lb (104.3 kg). His oral temperature is 98.3 F (36.8 C). His blood pressure is 132/82 and his pulse is 54 (abnormal). His respiration is 17 and oxygen saturation is 99%.   Wt Readings from Last 3 Encounters:  11/23/20 230 lb (104.3 kg)  08/14/20 222 lb 8 oz (100.9 kg)  05/15/20 215 lb 12.8 oz (97.9 kg)    Physical Exam Vitals reviewed.  HENT:     Head: Normocephalic and atraumatic.  Eyes:      Pupils: Pupils are equal, round, and reactive to light.  Cardiovascular:     Rate and Rhythm: Normal rate and regular rhythm.     Heart sounds: Normal heart sounds.  Pulmonary:     Effort: Pulmonary effort is normal.     Breath sounds: Normal breath sounds.  Abdominal:     General: Bowel sounds are normal.     Palpations: Abdomen is soft.  Musculoskeletal:        General: No tenderness or deformity. Normal range of motion.     Cervical back: Normal range of motion.  Lymphadenopathy:     Cervical: No cervical adenopathy.  Skin:    General: Skin is warm and dry.     Findings: No erythema or rash.  Neurological:     Mental Status: He is alert and oriented to person, place, and time.  Psychiatric:        Behavior: Behavior normal.        Thought Content: Thought content normal.        Judgment: Judgment normal.      Lab Results  Component Value Date   WBC 6.0 11/23/2020   HGB 13.4 11/23/2020   HCT 39.6 11/23/2020   MCV 95.4 11/23/2020   PLT 187 11/23/2020     Chemistry      Component Value Date/Time   NA 143 11/23/2020 1346   K 4.8 11/23/2020 1346   CL 105 11/23/2020 1346   CO2 30 11/23/2020 1346   BUN 26 (H) 11/23/2020 1346   CREATININE 0.96 11/23/2020 1346      Component Value Date/Time   CALCIUM 9.8 11/23/2020 1346   ALKPHOS 51 11/23/2020 1346   AST 22 11/23/2020 1346   ALT 18 11/23/2020 1346   BILITOT 0.4 11/23/2020 1346       Impression and Plan: Frederick Cruz is a 64 year old white male.  He has metastatic prostate cancer.  He clearly has oligo metastatic disease.  At this point, I think we can hold off on doing any scans on him as long as his PSA is low.  He will get his Eligard today.  I think every 89-month follow-up is certainly working well.  We will get him back before his down to [place].   [name], MD 12/20/20212:58 PM

## 2020-11-24 ENCOUNTER — Encounter: Payer: Self-pay | Admitting: *Deleted

## 2020-11-24 LAB — PSA, TOTAL AND FREE
PSA, Free Pct: UNDETERMINED %
PSA, Free: <0.01 ng/mL
Prostate Specific Ag, Serum: <0.1 ng/mL (ref 0.0–4.0)

## 2020-11-24 LAB — TESTOSTERONE: Testosterone: <3 ng/dL — ABNORMAL LOW (ref 264–916)

## 2021-01-01 ENCOUNTER — Other Ambulatory Visit: Payer: Self-pay | Admitting: Hematology & Oncology

## 2021-01-01 DIAGNOSIS — N5231 Erectile dysfunction following radical prostatectomy: Secondary | ICD-10-CM

## 2021-01-01 DIAGNOSIS — C775 Secondary and unspecified malignant neoplasm of intrapelvic lymph nodes: Secondary | ICD-10-CM

## 2021-01-01 DIAGNOSIS — C61 Malignant neoplasm of prostate: Secondary | ICD-10-CM

## 2021-02-22 ENCOUNTER — Inpatient Hospital Stay (HOSPITAL_BASED_OUTPATIENT_CLINIC_OR_DEPARTMENT_OTHER): Payer: Medicare Other | Admitting: Hematology & Oncology

## 2021-02-22 ENCOUNTER — Inpatient Hospital Stay: Payer: Medicare Other | Attending: Hematology & Oncology

## 2021-02-22 ENCOUNTER — Inpatient Hospital Stay: Payer: Medicare Other

## 2021-02-22 ENCOUNTER — Other Ambulatory Visit: Payer: Self-pay

## 2021-02-22 ENCOUNTER — Encounter: Payer: Self-pay | Admitting: Hematology & Oncology

## 2021-02-22 VITALS — BP 135/88 | HR 50 | Temp 98.6°F | Resp 20 | Wt 232.4 lb

## 2021-02-22 DIAGNOSIS — C61 Malignant neoplasm of prostate: Secondary | ICD-10-CM

## 2021-02-22 DIAGNOSIS — Z5111 Encounter for antineoplastic chemotherapy: Secondary | ICD-10-CM | POA: Diagnosis not present

## 2021-02-22 DIAGNOSIS — C775 Secondary and unspecified malignant neoplasm of intrapelvic lymph nodes: Secondary | ICD-10-CM | POA: Diagnosis not present

## 2021-02-22 LAB — CBC WITH DIFFERENTIAL (CANCER CENTER ONLY)
Abs Immature Granulocytes: 0.02 10*3/uL (ref 0.00–0.07)
Basophils Absolute: 0.1 10*3/uL (ref 0.0–0.1)
Basophils Relative: 1 %
Eosinophils Absolute: 0.2 10*3/uL (ref 0.0–0.5)
Eosinophils Relative: 3 %
HCT: 39.4 % (ref 39.0–52.0)
Hemoglobin: 13.4 g/dL (ref 13.0–17.0)
Immature Granulocytes: 0 %
Lymphocytes Relative: 36 %
Lymphs Abs: 2 10*3/uL (ref 0.7–4.0)
MCH: 31.9 pg (ref 26.0–34.0)
MCHC: 34 g/dL (ref 30.0–36.0)
MCV: 93.8 fL (ref 80.0–100.0)
Monocytes Absolute: 0.4 10*3/uL (ref 0.1–1.0)
Monocytes Relative: 8 %
Neutro Abs: 2.8 10*3/uL (ref 1.7–7.7)
Neutrophils Relative %: 52 %
Platelet Count: 188 10*3/uL (ref 150–400)
RBC: 4.2 MIL/uL — ABNORMAL LOW (ref 4.22–5.81)
RDW: 11.9 % (ref 11.5–15.5)
WBC Count: 5.4 10*3/uL (ref 4.0–10.5)
nRBC: 0 % (ref 0.0–0.2)

## 2021-02-22 LAB — CMP (CANCER CENTER ONLY)
ALT: 21 U/L (ref 0–44)
AST: 26 U/L (ref 15–41)
Albumin: 4.6 g/dL (ref 3.5–5.0)
Alkaline Phosphatase: 58 U/L (ref 38–126)
Anion gap: 7 (ref 5–15)
BUN: 20 mg/dL (ref 8–23)
CO2: 29 mmol/L (ref 22–32)
Calcium: 9.8 mg/dL (ref 8.9–10.3)
Chloride: 103 mmol/L (ref 98–111)
Creatinine: 0.96 mg/dL (ref 0.61–1.24)
GFR, Estimated: >60 mL/min (ref >60)
Glucose, Bld: 105 mg/dL — ABNORMAL HIGH (ref 70–99)
Potassium: 4.6 mmol/L (ref 3.5–5.1)
Sodium: 139 mmol/L (ref 135–145)
Total Bilirubin: 0.5 mg/dL (ref 0.3–1.2)
Total Protein: 7.2 g/dL (ref 6.5–8.1)

## 2021-02-22 MED ORDER — LEUPROLIDE ACETATE (3 MONTH) 22.5 MG ~~LOC~~ KIT
22.5000 mg | PACK | Freq: Once | SUBCUTANEOUS | Status: AC
  Administered 2021-02-22: 22.5 mg via SUBCUTANEOUS
  Filled 2021-02-22: qty 22.5

## 2021-02-22 NOTE — Patient Instructions (Signed)

## 2021-02-22 NOTE — Progress Notes (Signed)
Hematology and Oncology Follow Up Visit  Frederick Cruz [id number] [birth year] 65 y.o. 02/22/2021   Principle Diagnosis:   Metastatic prostate cancer-castrate responsive  Current Therapy:    Eligard 22.5 mg subcu every 3 months-next dose in 02/2021  Casodex 50 mg p.o. daily     Interim History:  Frederick Cruz is back for follow-up.  As always, he is doing quite well.  He really has no specific complaints.  He and his wife will be heading down to [place] in a couple weeks for a couple week trip.  He does have hot flashes.  I am sure this is from the [place] in the Casodex.  His last testosterone level was less than 3.  His PSA was less than 0.1.  He has had no problems with rashes.  He has had no change in bowel or bladder habits.  He has had no issues with leg swelling.  There is been no fever.  Overall, I would say his performance status is ECOG 0.     Medications:  Current Outpatient Medications:  .  atorvastatin (LIPITOR) 20 MG tablet, Take 20 mg by mouth daily., Disp: , Rfl:  .  bicalutamide (CASODEX) 50 MG tablet, TAKE 1 TABLET BY MOUTH EVERY DAY, Disp: 90 tablet, Rfl: 4 .  Calcium-Magnesium 300-300 MG TABS, Take 1 tablet by mouth daily., Disp: , Rfl:  .  Calcium-Magnesium 300-300 MG TABS, Take by mouth daily., Disp: , Rfl:  .  escitalopram (LEXAPRO) 5 MG tablet, Take 5 mg by mouth daily., Disp: , Rfl:  .  GLUTATHIONE PO, Take by mouth daily., Disp: , Rfl:  .  Melatonin 3 MG TABS, Take 3 mg by mouth at bedtime as needed. Take 2 tablets, total of 6 mg at bedtime as needed., Disp: , Rfl:  .  metoprolol succinate (TOPROL-XL) 25 MG 24 hr tablet, TAKE 1 TABLET (25 MG TOTAL) BY MOUTH DAILY. NEED OFFICE VISIT, Disp: 30 tablet, Rfl: 0 .  Multiple Vitamins-Minerals (CENTRUM SILVER 50+MEN PO), Take 1 tablet by mouth daily., Disp: , Rfl:  .  Omega-3 Fatty Acids (FISH OIL) 1000 MG CAPS, Take 1 capsule by mouth daily., Disp: , Rfl:  .  sildenafil (VIAGRA) 100 MG tablet, TAKE ONE  TABLET BY MOUTH DAILY AS NEEDED FOR FOR ERECTILE DYSFUNCTION, Disp: 10 tablet, Rfl: 3 .  TURMERIC PO, Take by mouth daily., Disp: , Rfl:  .  vitamin C (ASCORBIC ACID) 500 MG tablet, Take 500 mg by mouth daily., Disp: , Rfl:  .  vitamin E 400 UNIT capsule, Take 400 Units by mouth daily., Disp: , Rfl:  .  zinc gluconate 50 MG tablet, Take by mouth daily., Disp: , Rfl:  .  Turmeric 400 MG CAPS, Take by mouth., Disp: , Rfl:   Allergies:  Allergies  Allergen Reactions  . Clonazepam Other (See Comments)    Double vision    Past Medical History, Surgical history, Social history, and Family History were reviewed and updated.  Review of Systems: Review of Systems  Constitutional: Negative.   HENT:  Negative.   Eyes: Negative.   Respiratory: Negative.   Cardiovascular: Negative.   Gastrointestinal: Negative.   Endocrine: Positive for hot flashes.  Genitourinary: Negative.    Musculoskeletal: Negative.   Skin: Negative.   Neurological: Negative.   Hematological: Negative.   Psychiatric/Behavioral: Negative.     Physical Exam:  weight is 232 lb 6.4 oz (105.4 kg). His oral temperature is 98.6 F (37 C). His blood pressure is 135/88  and his pulse is 50 (abnormal). His respiration is 20 and oxygen saturation is 99%.   Wt Readings from Last 3 Encounters:  02/22/21 232 lb 6.4 oz (105.4 kg)  11/23/20 230 lb (104.3 kg)  08/14/20 222 lb 8 oz (100.9 kg)    Physical Exam Vitals reviewed.  HENT:     Head: Normocephalic and atraumatic.  Eyes:     Pupils: Pupils are equal, round, and reactive to light.  Cardiovascular:     Rate and Rhythm: Normal rate and regular rhythm.     Heart sounds: Normal heart sounds.  Pulmonary:     Effort: Pulmonary effort is normal.     Breath sounds: Normal breath sounds.  Abdominal:     General: Bowel sounds are normal.     Palpations: Abdomen is soft.  Musculoskeletal:        General: No tenderness or deformity. Normal range of motion.     Cervical  back: Normal range of motion.  Lymphadenopathy:     Cervical: No cervical adenopathy.  Skin:    General: Skin is warm and dry.     Findings: No erythema or rash.  Neurological:     Mental Status: He is alert and oriented to person, place, and time.  Psychiatric:        Behavior: Behavior normal.        Thought Content: Thought content normal.        Judgment: Judgment normal.      Lab Results  Component Value Date   WBC 5.4 02/22/2021   HGB 13.4 02/22/2021   HCT 39.4 02/22/2021   MCV 93.8 02/22/2021   PLT 188 02/22/2021     Chemistry      Component Value Date/Time   NA 143 11/23/2020 1346   K 4.8 11/23/2020 1346   CL 105 11/23/2020 1346   CO2 30 11/23/2020 1346   BUN 26 (H) 11/23/2020 1346   CREATININE 0.96 11/23/2020 1346      Component Value Date/Time   CALCIUM 9.8 11/23/2020 1346   ALKPHOS 51 11/23/2020 1346   AST 22 11/23/2020 1346   ALT 18 11/23/2020 1346   BILITOT 0.4 11/23/2020 1346       Impression and Plan: Frederick Cruz is a 65 year old white male.  He has metastatic prostate cancer.  He clearly has oligo metastatic disease.  At this point, I think we can hold off on doing any scans on him as long as his PSA is low.  He will get his Eligard today.  I think every 7-month follow-up is certainly working well.  We will see how sugar down to [place] goes.  I have no problems with him going down there.  His immune system is okay.  I am just happy that his quality of life is doing so well.    [name], MD 3/21/20222:14 PM

## 2021-02-23 ENCOUNTER — Telehealth: Payer: Self-pay | Admitting: *Deleted

## 2021-02-23 LAB — PSA, TOTAL AND FREE
PSA, Free Pct: UNDETERMINED %
PSA, Free: <0.01 ng/mL
Prostate Specific Ag, Serum: <0.1 ng/mL (ref 0.0–4.0)

## 2021-02-23 LAB — TESTOSTERONE: Testosterone: 218 ng/dL — ABNORMAL LOW (ref 264–916)

## 2021-02-23 NOTE — Telephone Encounter (Signed)
-----   Message from [name], MD sent at 02/23/2021  1:40 PM EDT ----- Call - the PSA is still NOT detectable!!  Frederick Cruz

## 2021-02-23 NOTE — Telephone Encounter (Signed)
Patient's wife notified per order of Dr. Marin Olp that "the PSA is still NOT detectable!!  [name]"  Pt.'s wife would like to know if Dr. Marin Olp is concerned about patient's increasing testosterone level.  Informed pt.'s wife that Dr. Marin Olp has left the office for the day and that I would speak with him about pt.'s testosterone level tomorrow and call her back.  Pt.'s wife is appreciative of call and has no further questions at this time.

## 2021-02-24 ENCOUNTER — Telehealth: Payer: Self-pay | Admitting: *Deleted

## 2021-02-24 ENCOUNTER — Telehealth: Payer: Self-pay | Admitting: Hematology & Oncology

## 2021-02-24 ENCOUNTER — Other Ambulatory Visit: Payer: Self-pay | Admitting: *Deleted

## 2021-02-24 DIAGNOSIS — C61 Malignant neoplasm of prostate: Secondary | ICD-10-CM

## 2021-02-24 DIAGNOSIS — Z191 Hormone sensitive malignancy status: Secondary | ICD-10-CM

## 2021-02-24 NOTE — Telephone Encounter (Signed)
Call placed to patient's wife and message left to inform her that Dr. Marin Olp would like a testosterone level drawn in one month and to call office back with any questions or concerns.  Message sent to scheduling.

## 2021-02-24 NOTE — Telephone Encounter (Signed)
I called and spoke with wife regarding lab only appointment scheduled per 3/23 sch msg

## 2021-04-02 ENCOUNTER — Other Ambulatory Visit: Payer: Self-pay

## 2021-04-02 ENCOUNTER — Inpatient Hospital Stay: Payer: Medicare Other | Attending: Hematology & Oncology

## 2021-04-02 DIAGNOSIS — C775 Secondary and unspecified malignant neoplasm of intrapelvic lymph nodes: Secondary | ICD-10-CM

## 2021-04-02 DIAGNOSIS — C61 Malignant neoplasm of prostate: Secondary | ICD-10-CM | POA: Diagnosis not present

## 2021-04-05 LAB — TESTOSTERONE, FREE, TOTAL, SHBG
Sex Hormone Binding: 75.1 nmol/L (ref 19.3–76.4)
Testosterone, Free: <0.2 pg/mL — ABNORMAL LOW (ref 6.6–18.1)
Testosterone: <3 ng/dL — ABNORMAL LOW (ref 264–916)

## 2021-05-26 ENCOUNTER — Inpatient Hospital Stay: Payer: Medicare Other | Attending: Hematology & Oncology

## 2021-05-26 ENCOUNTER — Inpatient Hospital Stay (HOSPITAL_BASED_OUTPATIENT_CLINIC_OR_DEPARTMENT_OTHER): Payer: Medicare Other | Admitting: Hematology & Oncology

## 2021-05-26 ENCOUNTER — Other Ambulatory Visit: Payer: Self-pay

## 2021-05-26 ENCOUNTER — Encounter: Payer: Self-pay | Admitting: Hematology & Oncology

## 2021-05-26 ENCOUNTER — Inpatient Hospital Stay: Payer: Medicare Other

## 2021-05-26 VITALS — BP 145/83 | HR 55 | Temp 98.3°F | Resp 20 | Wt 235.0 lb

## 2021-05-26 DIAGNOSIS — C61 Malignant neoplasm of prostate: Secondary | ICD-10-CM

## 2021-05-26 DIAGNOSIS — C775 Secondary and unspecified malignant neoplasm of intrapelvic lymph nodes: Secondary | ICD-10-CM

## 2021-05-26 DIAGNOSIS — Z5111 Encounter for antineoplastic chemotherapy: Secondary | ICD-10-CM | POA: Diagnosis not present

## 2021-05-26 LAB — CMP (CANCER CENTER ONLY)
ALT: 19 U/L (ref 0–44)
AST: 23 U/L (ref 15–41)
Albumin: 4.5 g/dL (ref 3.5–5.0)
Alkaline Phosphatase: 56 U/L (ref 38–126)
Anion gap: 6 (ref 5–15)
BUN: 32 mg/dL — ABNORMAL HIGH (ref 8–23)
CO2: 27 mmol/L (ref 22–32)
Calcium: 9.7 mg/dL (ref 8.9–10.3)
Chloride: 107 mmol/L (ref 98–111)
Creatinine: 0.91 mg/dL (ref 0.61–1.24)
GFR, Estimated: >60 mL/min (ref >60)
Glucose, Bld: 97 mg/dL (ref 70–99)
Potassium: 4.7 mmol/L (ref 3.5–5.1)
Sodium: 140 mmol/L (ref 135–145)
Total Bilirubin: 0.5 mg/dL (ref 0.3–1.2)
Total Protein: 7 g/dL (ref 6.5–8.1)

## 2021-05-26 LAB — CBC WITH DIFFERENTIAL (CANCER CENTER ONLY)
Abs Immature Granulocytes: 0.01 10*3/uL (ref 0.00–0.07)
Basophils Absolute: 0.1 10*3/uL (ref 0.0–0.1)
Basophils Relative: 1 %
Eosinophils Absolute: 0.2 10*3/uL (ref 0.0–0.5)
Eosinophils Relative: 3 %
HCT: 38.3 % — ABNORMAL LOW (ref 39.0–52.0)
Hemoglobin: 13.2 g/dL (ref 13.0–17.0)
Immature Granulocytes: 0 %
Lymphocytes Relative: 32 %
Lymphs Abs: 1.6 10*3/uL (ref 0.7–4.0)
MCH: 32.4 pg (ref 26.0–34.0)
MCHC: 34.5 g/dL (ref 30.0–36.0)
MCV: 93.9 fL (ref 80.0–100.0)
Monocytes Absolute: 0.3 10*3/uL (ref 0.1–1.0)
Monocytes Relative: 7 %
Neutro Abs: 2.9 10*3/uL (ref 1.7–7.7)
Neutrophils Relative %: 57 %
Platelet Count: 180 10*3/uL (ref 150–400)
RBC: 4.08 MIL/uL — ABNORMAL LOW (ref 4.22–5.81)
RDW: 11.9 % (ref 11.5–15.5)
WBC Count: 5.1 10*3/uL (ref 4.0–10.5)
nRBC: 0 % (ref 0.0–0.2)

## 2021-05-26 MED ORDER — LEUPROLIDE ACETATE (3 MONTH) 22.5 MG ~~LOC~~ KIT
22.5000 mg | PACK | Freq: Once | SUBCUTANEOUS | Status: AC
  Administered 2021-05-26: 22.5 mg via SUBCUTANEOUS
  Filled 2021-05-26: qty 22.5

## 2021-05-26 NOTE — Patient Instructions (Signed)
Leuprolide depot injection What is this medication? LEUPROLIDE (loo PROE lide) is a man-made protein that acts like a natural hormone in the body. It decreases testosterone in men and decreases estrogen in women. In men, this medicine is used to treat advanced prostate cancer. In women, some forms of this medicine may be used to treat endometriosis, uterinefibroids, or other male hormone-related problems. This medicine may be used for other purposes; ask your health care provider orpharmacist if you have questions. COMMON BRAND NAME(S): Eligard, Fensolv, Lupron Depot, Lupron Depot-Ped, Viadur What should I tell my care team before I take this medication? They need to know if you have any of these conditions: diabetes heart disease or previous heart attack high blood pressure high cholesterol mental illness osteoporosis pain or difficulty passing urine seizures spinal cord metastasis stroke suicidal thoughts, plans, or attempt; a previous suicide attempt by you or a family member tobacco smoker unusual vaginal bleeding (women) an unusual or allergic reaction to leuprolide, benzyl alcohol, other medicines, foods, dyes, or preservatives pregnant or trying to get pregnant breast-feeding How should I use this medication? This medicine is for injection into a muscle or for injection under the skin. It is given by a health care professional in a hospital or clinic setting. The specific product will determine how it will be given to you. Make sure youunderstand which product you receive and how often you will receive it. Talk to your pediatrician regarding the use of this medicine in children.Special care may be needed. Overdosage: If you think you have taken too much of this medicine contact apoison control center or emergency room at once. NOTE: This medicine is only for you. Do not share this medicine with others. What if I miss a dose? It is important not to miss a dose. Call your doctor  or health careprofessional if you are unable to keep an appointment. Depot injections: Depot injections are given either once-monthly, every 12 weeks, every 16 weeks, or every 24 weeks depending on the product you are prescribed. The product you are prescribed will be based on if you are male orfemale, and your condition. Make sure you understand your product and dosing. What may interact with this medication? Do not take this medicine with any of the following medications: chasteberry cisapride dronedarone pimozide thioridazine This medicine may also interact with the following medications: herbal or dietary supplements, like black cohosh or DHEA male hormones, like estrogens or progestins and birth control pills, patches, rings, or injections male hormones, like testosterone other medicines that prolong the QT interval (abnormal heart rhythm) This list may not describe all possible interactions. Give your health care provider a list of all the medicines, herbs, non-prescription drugs, or dietary supplements you use. Also tell them if you smoke, drink alcohol, or use illegaldrugs. Some items may interact with your medicine. What should I watch for while using this medication? Visit your doctor or health care professional for regular checks on your progress. During the first weeks of treatment, your symptoms may get worse, but then will improve as you continue your treatment. You may get hot flashes, increased bone pain, increased difficulty passing urine, or an aggravation of nerve symptoms. Discuss these effects with your doctor or health careprofessional, some of them may improve with continued use of this medicine. Male patients may experience a menstrual cycle or spotting during the first months of therapy with this medicine. If this continues, contact your doctor orhealth care professional. This medicine may increase blood sugar. Ask  your healthcare provider if changesin diet or medicines  are needed if you have diabetes. What side effects may I notice from receiving this medication? Side effects that you should report to your doctor or health care professionalas soon as possible: allergic reactions like skin rash, itching or hives, swelling of the face, lips, or tongue breathing problems chest pain depression or memory disorders pain in your legs or groin pain at site where injected or implanted seizures severe headache signs and symptoms of high blood sugar such as being more thirsty or hungry or having to urinate more than normal. You may also feel very tired or have blurry vision swelling of the feet and legs suicidal thoughts or other mood changes visual changes vomiting Side effects that usually do not require medical attention (report to yourdoctor or health care professional if they continue or are bothersome): breast swelling or tenderness decrease in sex drive or performance diarrhea hot flashes loss of appetite muscle, joint, or bone pains nausea redness or irritation at site where injected or implanted skin problems or acne This list may not describe all possible side effects. Call your doctor for medical advice about side effects. You may report side effects to FDA at1-800-FDA-1088. Where should I keep my medication? This drug is given in a hospital or clinic and will not be stored at home. NOTE: This sheet is a summary. It may not cover all possible information. If you have questions about this medicine, talk to your doctor, pharmacist, orhealth care provider.  2022 Elsevier/Gold Standard (2019-10-23 10:35:13)

## 2021-05-26 NOTE — Progress Notes (Signed)
Hematology and Oncology Follow Up Visit  Frederick Cruz [id number] [birth year] 65 y.o. 05/26/2021   Principle Diagnosis:  Metastatic prostate cancer-castrate responsive  Current Therapy:   Eligard 22.5 mg subcu every 3 months-next dose in 08/2021 Casodex 50 mg p.o. daily     Interim History:  Frederick Cruz is back for follow-up.  Since we last saw him, he has been doing quite well.  He and his wife went on their camping trip.  [place]  That a wonderful time.  They really enjoyed themselves.  His Eligard/Casodex is worked incredibly well.  Back in March, his PSA was less than 0.1.  His testosterone was less than 3.  He has had no problems.  He has had no problems with nausea or vomiting.  He has been no issues with cough.  He has had no issues with COVID.  Has been no problems with rashes.  He has had no change in bowel or bladder habits.  He does have some hot flashes.  Overall, his performance status is ECOG 0.     Medications:  Current Outpatient Medications:    atorvastatin (LIPITOR) 20 MG tablet, Take 20 mg by mouth daily., Disp: , Rfl:    bicalutamide (CASODEX) 50 MG tablet, TAKE 1 TABLET BY MOUTH EVERY DAY, Disp: 90 tablet, Rfl: 4   Calcium-Magnesium 300-300 MG TABS, Take by mouth daily., Disp: , Rfl:    escitalopram (LEXAPRO) 5 MG tablet, Take 5 mg by mouth daily., Disp: , Rfl:    GLUTATHIONE PO, Take by mouth daily., Disp: , Rfl:    Melatonin 3 MG TABS, Take 3 mg by mouth at bedtime as needed. Take 2 tablets, total of 6 mg at bedtime as needed., Disp: , Rfl:    metoprolol succinate (TOPROL-XL) 25 MG 24 hr tablet, TAKE 1 TABLET (25 MG TOTAL) BY MOUTH DAILY. NEED OFFICE VISIT, Disp: 30 tablet, Rfl: 0   Multiple Vitamins-Minerals (CENTRUM SILVER 50+MEN PO), Take 1 tablet by mouth daily., Disp: , Rfl:    Omega-3 Fatty Acids (FISH OIL) 1000 MG CAPS, Take 1 capsule by mouth daily., Disp: , Rfl:    sildenafil (VIAGRA) 100 MG tablet, TAKE  ONE TABLET BY MOUTH DAILY AS NEEDED FOR FOR ERECTILE DYSFUNCTION, Disp: 10 tablet, Rfl: 3   TURMERIC PO, Take by mouth daily., Disp: , Rfl:    vitamin C (ASCORBIC ACID) 500 MG tablet, Take 500 mg by mouth daily., Disp: , Rfl:    vitamin E 400 UNIT capsule, Take 400 Units by mouth daily., Disp: , Rfl:    zinc gluconate 50 MG tablet, Take by mouth daily., Disp: , Rfl:  No current facility-administered medications for this visit.  Facility-Administered Medications Ordered in Other Visits:    Leuprolide Acetate (3 Month) (ELIGARD) 22.5 MG injection 22.5 mg, 22.5 mg, Subcutaneous, Once, Taylah Dubiel, Rudell Cobb, MD  Allergies:  Allergies  Allergen Reactions   Clonazepam Other (See Comments)    Double vision    Past Medical History, Surgical history, Social history, and Family History were reviewed and updated.  Review of Systems: Review of Systems  Constitutional: Negative.   HENT:  Negative.    Eyes: Negative.   Respiratory: Negative.    Cardiovascular: Negative.   Gastrointestinal: Negative.   Endocrine: Positive for hot flashes.  Genitourinary: Negative.    Musculoskeletal: Negative.   Skin: Negative.   Neurological: Negative.   Hematological: Negative.   Psychiatric/Behavioral: Negative.     Physical Exam:  weight  is 235 lb (106.6 kg). His oral temperature is 98.3 F (36.8 C). His blood pressure is 145/83 (abnormal) and his pulse is 55 (abnormal). His respiration is 20 and oxygen saturation is 100%.   Wt Readings from Last 3 Encounters:  05/26/21 235 lb (106.6 kg)  02/22/21 232 lb 6.4 oz (105.4 kg)  11/23/20 230 lb (104.3 kg)    Physical Exam Vitals reviewed.  HENT:     Head: Normocephalic and atraumatic.  Eyes:     Pupils: Pupils are equal, round, and reactive to light.  Cardiovascular:     Rate and Rhythm: Normal rate and regular rhythm.     Heart sounds: Normal heart sounds.  Pulmonary:     Effort: Pulmonary effort is normal.     Breath sounds: Normal breath sounds.   Abdominal:     General: Bowel sounds are normal.     Palpations: Abdomen is soft.  Musculoskeletal:        General: No tenderness or deformity. Normal range of motion.     Cervical back: Normal range of motion.  Lymphadenopathy:     Cervical: No cervical adenopathy.  Skin:    General: Skin is warm and dry.     Findings: No erythema or rash.  Neurological:     Mental Status: He is alert and oriented to person, place, and time.  Psychiatric:        Behavior: Behavior normal.        Thought Content: Thought content normal.        Judgment: Judgment normal.     Lab Results  Component Value Date   WBC 5.1 05/26/2021   HGB 13.2 05/26/2021   HCT 38.3 (L) 05/26/2021   MCV 93.9 05/26/2021   PLT 180 05/26/2021     Chemistry      Component Value Date/Time   NA 139 02/22/2021 1343   K 4.6 02/22/2021 1343   CL 103 02/22/2021 1343   CO2 29 02/22/2021 1343   BUN 20 02/22/2021 1343   CREATININE 0.96 02/22/2021 1343      Component Value Date/Time   CALCIUM 9.8 02/22/2021 1343   ALKPHOS 58 02/22/2021 1343   AST 26 02/22/2021 1343   ALT 21 02/22/2021 1343   BILITOT 0.5 02/22/2021 1343       Impression and Plan: Frederick Cruz is a 65 year old white male.  He has metastatic prostate cancer.  He clearly has oligo metastatic disease.  At this point, I think we can hold off on doing any scans on him as long as his PSA is low.  He will get his Eligard today.  I think every 36-month follow-up is certainly working well.  We will see how sugar down to [place] goes.  I have no problems with him going down there.  His immune system is okay.  I am just happy that his quality of life is doing so well.    Frederick Napoleon, MD 6/22/20222:12 PM

## 2021-05-27 ENCOUNTER — Telehealth: Payer: Self-pay

## 2021-05-27 LAB — PSA, TOTAL AND FREE
PSA, Free Pct: UNDETERMINED %
PSA, Free: <0.01 ng/mL
Prostate Specific Ag, Serum: <0.1 ng/mL (ref 0.0–4.0)

## 2021-05-27 LAB — TESTOSTERONE: Testosterone: <3 ng/dL — ABNORMAL LOW (ref 264–916)

## 2021-05-27 NOTE — Telephone Encounter (Signed)
No 05/26/21 los noted/entered    [employer]

## 2021-07-27 ENCOUNTER — Telehealth: Payer: Self-pay

## 2021-07-27 LAB — COLOGUARD: COLOGUARD: POSITIVE — AB

## 2021-08-31 ENCOUNTER — Encounter: Payer: Self-pay | Admitting: Hematology & Oncology

## 2021-08-31 ENCOUNTER — Other Ambulatory Visit: Payer: Self-pay | Admitting: Pharmacist

## 2021-08-31 ENCOUNTER — Inpatient Hospital Stay: Payer: Medicare Other | Attending: Hematology & Oncology

## 2021-08-31 ENCOUNTER — Inpatient Hospital Stay: Payer: Medicare Other

## 2021-08-31 ENCOUNTER — Other Ambulatory Visit: Payer: Self-pay

## 2021-08-31 ENCOUNTER — Inpatient Hospital Stay (HOSPITAL_BASED_OUTPATIENT_CLINIC_OR_DEPARTMENT_OTHER): Payer: Medicare Other | Admitting: Hematology & Oncology

## 2021-08-31 ENCOUNTER — Telehealth: Payer: Self-pay | Admitting: Hematology & Oncology

## 2021-08-31 VITALS — BP 124/90 | HR 64 | Temp 98.3°F | Resp 17 | Wt 232.0 lb

## 2021-08-31 DIAGNOSIS — C61 Malignant neoplasm of prostate: Secondary | ICD-10-CM

## 2021-08-31 DIAGNOSIS — C775 Secondary and unspecified malignant neoplasm of intrapelvic lymph nodes: Secondary | ICD-10-CM

## 2021-08-31 DIAGNOSIS — C799 Secondary malignant neoplasm of unspecified site: Secondary | ICD-10-CM | POA: Diagnosis not present

## 2021-08-31 DIAGNOSIS — Z5111 Encounter for antineoplastic chemotherapy: Secondary | ICD-10-CM | POA: Diagnosis present

## 2021-08-31 LAB — CMP (CANCER CENTER ONLY)
ALT: 20 U/L (ref 0–44)
AST: 22 U/L (ref 15–41)
Albumin: 4.5 g/dL (ref 3.5–5.0)
Alkaline Phosphatase: 52 U/L (ref 38–126)
Anion gap: 8 (ref 5–15)
BUN: 24 mg/dL — ABNORMAL HIGH (ref 8–23)
CO2: 29 mmol/L (ref 22–32)
Calcium: 9.7 mg/dL (ref 8.9–10.3)
Chloride: 106 mmol/L (ref 98–111)
Creatinine: 0.9 mg/dL (ref 0.61–1.24)
GFR, Estimated: >60 mL/min (ref >60)
Glucose, Bld: 102 mg/dL — ABNORMAL HIGH (ref 70–99)
Potassium: 4.4 mmol/L (ref 3.5–5.1)
Sodium: 143 mmol/L (ref 135–145)
Total Bilirubin: 0.6 mg/dL (ref 0.3–1.2)
Total Protein: 7.2 g/dL (ref 6.5–8.1)

## 2021-08-31 LAB — CBC WITH DIFFERENTIAL (CANCER CENTER ONLY)
Abs Immature Granulocytes: 0.01 10*3/uL (ref 0.00–0.07)
Basophils Absolute: 0.1 10*3/uL (ref 0.0–0.1)
Basophils Relative: 1 %
Eosinophils Absolute: 0.2 10*3/uL (ref 0.0–0.5)
Eosinophils Relative: 5 %
HCT: 39.1 % (ref 39.0–52.0)
Hemoglobin: 13.3 g/dL (ref 13.0–17.0)
Immature Granulocytes: 0 %
Lymphocytes Relative: 38 %
Lymphs Abs: 1.5 10*3/uL (ref 0.7–4.0)
MCH: 32 pg (ref 26.0–34.0)
MCHC: 34 g/dL (ref 30.0–36.0)
MCV: 94.2 fL (ref 80.0–100.0)
Monocytes Absolute: 0.5 10*3/uL (ref 0.1–1.0)
Monocytes Relative: 12 %
Neutro Abs: 1.6 10*3/uL — ABNORMAL LOW (ref 1.7–7.7)
Neutrophils Relative %: 44 %
Platelet Count: 174 10*3/uL (ref 150–400)
RBC: 4.15 MIL/uL — ABNORMAL LOW (ref 4.22–5.81)
RDW: 11.9 % (ref 11.5–15.5)
WBC Count: 3.8 10*3/uL — ABNORMAL LOW (ref 4.0–10.5)
nRBC: 0 % (ref 0.0–0.2)

## 2021-08-31 MED ORDER — LEUPROLIDE ACETATE (3 MONTH) 22.5 MG ~~LOC~~ KIT
22.5000 mg | PACK | Freq: Once | SUBCUTANEOUS | Status: AC
  Administered 2021-08-31: 22.5 mg via SUBCUTANEOUS
  Filled 2021-08-31: qty 22.5

## 2021-08-31 NOTE — Progress Notes (Signed)
Hematology and Oncology Follow Up Visit  TEAL BONTRAGER [id number] 19-Jan-1956 65 y.o. 08/31/2021   Principle Diagnosis:  Metastatic prostate cancer-castrate responsive  Current Therapy:   Eligard 22.5 mg subcu every 3 months-next dose in 11/2021 Casodex 50 mg p.o. daily     Interim History:  Mr. Hearne is back for follow-up.  He comes in with his wife.  As always, they have been going on And trips.  It sounds like to be going on another cabin trip up to the [facility].  He feels well.  His last PSA was less than 0.1.  His last testosterone was less than 3.  He clearly is castrate level testosterone.  He has had no problems with diarrhea.  He has had no nausea or vomiting.  There is been no cough or shortness of breath.  He has avoided the COVID while camping.  He does have some hot flashes which is no surprise.  Currently, I would say his performance status is probably ECOG 0.      Medications:  Current Outpatient Medications:    atorvastatin (LIPITOR) 20 MG tablet, Take 20 mg by mouth daily., Disp: , Rfl:    bicalutamide (CASODEX) 50 MG tablet, TAKE 1 TABLET BY MOUTH EVERY DAY, Disp: 90 tablet, Rfl: 4   Calcium-Magnesium 300-300 MG TABS, Take by mouth daily., Disp: , Rfl:    escitalopram (LEXAPRO) 5 MG tablet, Take 5 mg by mouth daily., Disp: , Rfl:    GLUTATHIONE PO, Take by mouth daily., Disp: , Rfl:    Melatonin 3 MG TABS, Take 3 mg by mouth at bedtime as needed. Take 2 tablets, total of 6 mg at bedtime as needed., Disp: , Rfl:    metoprolol succinate (TOPROL-XL) 25 MG 24 hr tablet, TAKE 1 TABLET (25 MG TOTAL) BY MOUTH DAILY. NEED OFFICE VISIT, Disp: 30 tablet, Rfl: 0   Multiple Vitamins-Minerals (CENTRUM SILVER 50+MEN PO), Take 1 tablet by mouth daily., Disp: , Rfl:    Omega-3 Fatty Acids (FISH OIL) 1000 MG CAPS, Take 1 capsule by mouth daily., Disp: , Rfl:    sildenafil (VIAGRA) 100 MG tablet, TAKE ONE TABLET BY MOUTH DAILY AS NEEDED FOR FOR ERECTILE  DYSFUNCTION, Disp: 10 tablet, Rfl: 3   TURMERIC PO, Take by mouth daily., Disp: , Rfl:    vitamin C (ASCORBIC ACID) 500 MG tablet, Take 500 mg by mouth daily., Disp: , Rfl:    vitamin E 400 UNIT capsule, Take 400 Units by mouth daily., Disp: , Rfl:    zinc gluconate 50 MG tablet, Take by mouth daily., Disp: , Rfl:   Allergies:  Allergies  Allergen Reactions   Clonazepam Other (See Comments)    Double vision    Past Medical History, Surgical history, Social history, and Family History were reviewed and updated.  Review of Systems: Review of Systems  Constitutional: Negative.   HENT:  Negative.    Eyes: Negative.   Respiratory: Negative.    Cardiovascular: Negative.   Gastrointestinal: Negative.   Endocrine: Positive for hot flashes.  Genitourinary: Negative.    Musculoskeletal: Negative.   Skin: Negative.   Neurological: Negative.   Hematological: Negative.   Psychiatric/Behavioral: Negative.     Physical Exam:  vitals were not taken for this visit.   Wt Readings from Last 3 Encounters:  05/26/21 235 lb (106.6 kg)  02/22/21 232 lb 6.4 oz (105.4 kg)  11/23/20 230 lb (104.3 kg)    Physical Exam Vitals reviewed.  HENT:  Head: Normocephalic and atraumatic.  Eyes:     Pupils: Pupils are equal, round, and reactive to light.  Cardiovascular:     Rate and Rhythm: Normal rate and regular rhythm.     Heart sounds: Normal heart sounds.  Pulmonary:     Effort: Pulmonary effort is normal.     Breath sounds: Normal breath sounds.  Abdominal:     General: Bowel sounds are normal.     Palpations: Abdomen is soft.  Musculoskeletal:        General: No tenderness or deformity. Normal range of motion.     Cervical back: Normal range of motion.  Lymphadenopathy:     Cervical: No cervical adenopathy.  Skin:    General: Skin is warm and dry.     Findings: No erythema or rash.  Neurological:     Mental Status: He is alert and oriented to person, place, and time.   Psychiatric:        Behavior: Behavior normal.        Thought Content: Thought content normal.        Judgment: Judgment normal.     Lab Results  Component Value Date   WBC 3.8 (L) 08/31/2021   HGB 13.3 08/31/2021   HCT 39.1 08/31/2021   MCV 94.2 08/31/2021   PLT 174 08/31/2021     Chemistry      Component Value Date/Time   NA 140 05/26/2021 1342   K 4.7 05/26/2021 1342   CL 107 05/26/2021 1342   CO2 27 05/26/2021 1342   BUN 32 (H) 05/26/2021 1342   CREATININE 0.91 05/26/2021 1342      Component Value Date/Time   CALCIUM 9.7 05/26/2021 1342   ALKPHOS 56 05/26/2021 1342   AST 23 05/26/2021 1342   ALT 19 05/26/2021 1342   BILITOT 0.5 05/26/2021 1342       Impression and Plan: Mr. Orantes is a 65 year old white male.  He has metastatic prostate cancer.  He clearly has oligo metastatic disease.  I really think that everything is going nicely.  I really do not see that we have to do any scans on him.  I have to believe that he is got to be in remission by his PSA.  We will continue him on the every 34-month protocol.  I know he will have a good time camping.  We will plan to get him back in 3 more months.   [name], MD 9/27/20228:33 AM

## 2021-08-31 NOTE — Telephone Encounter (Signed)
Scheduled appt per 9/27 los - mailed letter with appt date and time

## 2021-08-31 NOTE — Patient Instructions (Signed)
Leuprolide depot injection What is this medication? LEUPROLIDE (loo PROE lide) is a man-made protein that acts like a natural hormone in the body. It decreases testosterone in men and decreases estrogen in women. In men, this medicine is used to treat advanced prostate cancer. In women, some forms of this medicine may be used to treat endometriosis, uterine fibroids, or other male hormone-related problems. This medicine may be used for other purposes; ask your health care provider or pharmacist if you have questions. COMMON BRAND NAME(S): Eligard, Fensolv, Lupron Depot, Lupron Depot-Ped, Viadur What should I tell my care team before I take this medication? They need to know if you have any of these conditions: diabetes heart disease or previous heart attack high blood pressure high cholesterol mental illness osteoporosis pain or difficulty passing urine seizures spinal cord metastasis stroke suicidal thoughts, plans, or attempt; a previous suicide attempt by you or a family member tobacco smoker unusual vaginal bleeding (women) an unusual or allergic reaction to leuprolide, benzyl alcohol, other medicines, foods, dyes, or preservatives pregnant or trying to get pregnant breast-feeding How should I use this medication? This medicine is for injection into a muscle or for injection under the skin. It is given by a health care professional in a hospital or clinic setting. The specific product will determine how it will be given to you. Make sure you understand which product you receive and how often you will receive it. Talk to your pediatrician regarding the use of this medicine in children. Special care may be needed. Overdosage: If you think you have taken too much of this medicine contact a poison control center or emergency room at once. NOTE: This medicine is only for you. Do not share this medicine with others. What if I miss a dose? It is important not to miss a dose. Call your  doctor or health care professional if you are unable to keep an appointment. Depot injections: Depot injections are given either once-monthly, every 12 weeks, every 16 weeks, or every 24 weeks depending on the product you are prescribed. The product you are prescribed will be based on if you are male or male, and your condition. Make sure you understand your product and dosing. What may interact with this medication? Do not take this medicine with any of the following medications: chasteberry cisapride dronedarone pimozide thioridazine This medicine may also interact with the following medications: herbal or dietary supplements, like black cohosh or DHEA male hormones, like estrogens or progestins and birth control pills, patches, rings, or injections male hormones, like testosterone other medicines that prolong the QT interval (abnormal heart rhythm) This list may not describe all possible interactions. Give your health care provider a list of all the medicines, herbs, non-prescription drugs, or dietary supplements you use. Also tell them if you smoke, drink alcohol, or use illegal drugs. Some items may interact with your medicine. What should I watch for while using this medication? Visit your doctor or health care professional for regular checks on your progress. During the first weeks of treatment, your symptoms may get worse, but then will improve as you continue your treatment. You may get hot flashes, increased bone pain, increased difficulty passing urine, or an aggravation of nerve symptoms. Discuss these effects with your doctor or health care professional, some of them may improve with continued use of this medicine. Male patients may experience a menstrual cycle or spotting during the first months of therapy with this medicine. If this continues, contact your doctor or  health care professional. This medicine may increase blood sugar. Ask your healthcare provider if changes in diet  or medicines are needed if you have diabetes. What side effects may I notice from receiving this medication? Side effects that you should report to your doctor or health care professional as soon as possible: allergic reactions like skin rash, itching or hives, swelling of the face, lips, or tongue breathing problems chest pain depression or memory disorders pain in your legs or groin pain at site where injected or implanted seizures severe headache signs and symptoms of high blood sugar such as being more thirsty or hungry or having to urinate more than normal. You may also feel very tired or have blurry vision swelling of the feet and legs suicidal thoughts or other mood changes visual changes vomiting Side effects that usually do not require medical attention (report to your doctor or health care professional if they continue or are bothersome): breast swelling or tenderness decrease in sex drive or performance diarrhea hot flashes loss of appetite muscle, joint, or bone pains nausea redness or irritation at site where injected or implanted skin problems or acne This list may not describe all possible side effects. Call your doctor for medical advice about side effects. You may report side effects to FDA at 1-800-FDA-1088. Where should I keep my medication? This drug is given in a hospital or clinic and will not be stored at home. NOTE: This sheet is a summary. It may not cover all possible information. If you have questions about this medicine, talk to your doctor, pharmacist, or health care provider.  2022 Elsevier/Gold Standard (2019-10-23 10:35:13)

## 2021-09-01 ENCOUNTER — Encounter: Payer: Self-pay | Admitting: *Deleted

## 2021-09-01 LAB — PSA, TOTAL AND FREE
PSA, Free Pct: UNDETERMINED %
PSA, Free: <0.01 ng/mL
Prostate Specific Ag, Serum: <0.1 ng/mL (ref 0.0–4.0)

## 2021-09-01 LAB — TESTOSTERONE: Testosterone: <3 ng/dL — ABNORMAL LOW (ref 264–916)

## 2021-10-05 ENCOUNTER — Other Ambulatory Visit: Payer: Self-pay | Admitting: Hematology & Oncology

## 2021-12-02 ENCOUNTER — Inpatient Hospital Stay (HOSPITAL_BASED_OUTPATIENT_CLINIC_OR_DEPARTMENT_OTHER): Payer: Medicare Other | Admitting: Hematology & Oncology

## 2021-12-02 ENCOUNTER — Inpatient Hospital Stay: Payer: Medicare Other | Attending: Hematology & Oncology

## 2021-12-02 ENCOUNTER — Other Ambulatory Visit: Payer: Self-pay

## 2021-12-02 ENCOUNTER — Encounter: Payer: Self-pay | Admitting: Hematology & Oncology

## 2021-12-02 ENCOUNTER — Inpatient Hospital Stay: Payer: Medicare Other

## 2021-12-02 VITALS — BP 136/82 | HR 61 | Temp 98.3°F | Resp 20 | Wt 233.1 lb

## 2021-12-02 DIAGNOSIS — C61 Malignant neoplasm of prostate: Secondary | ICD-10-CM

## 2021-12-02 DIAGNOSIS — Z5111 Encounter for antineoplastic chemotherapy: Secondary | ICD-10-CM | POA: Insufficient documentation

## 2021-12-02 DIAGNOSIS — C775 Secondary and unspecified malignant neoplasm of intrapelvic lymph nodes: Secondary | ICD-10-CM

## 2021-12-02 LAB — CBC WITH DIFFERENTIAL (CANCER CENTER ONLY)
Abs Immature Granulocytes: 0.01 10*3/uL (ref 0.00–0.07)
Basophils Absolute: 0.1 10*3/uL (ref 0.0–0.1)
Basophils Relative: 1 %
Eosinophils Absolute: 0.3 10*3/uL (ref 0.0–0.5)
Eosinophils Relative: 5 %
HCT: 37.8 % — ABNORMAL LOW (ref 39.0–52.0)
Hemoglobin: 13.2 g/dL (ref 13.0–17.0)
Immature Granulocytes: 0 %
Lymphocytes Relative: 37 %
Lymphs Abs: 2.3 10*3/uL (ref 0.7–4.0)
MCH: 32.2 pg (ref 26.0–34.0)
MCHC: 34.9 g/dL (ref 30.0–36.0)
MCV: 92.2 fL (ref 80.0–100.0)
Monocytes Absolute: 0.7 10*3/uL (ref 0.1–1.0)
Monocytes Relative: 11 %
Neutro Abs: 2.8 10*3/uL (ref 1.7–7.7)
Neutrophils Relative %: 46 %
Platelet Count: 167 10*3/uL (ref 150–400)
RBC: 4.1 MIL/uL — ABNORMAL LOW (ref 4.22–5.81)
RDW: 11.9 % (ref 11.5–15.5)
WBC Count: 6.1 10*3/uL (ref 4.0–10.5)
nRBC: 0 % (ref 0.0–0.2)

## 2021-12-02 LAB — CMP (CANCER CENTER ONLY)
ALT: 15 U/L (ref 0–44)
AST: 20 U/L (ref 15–41)
Albumin: 4.5 g/dL (ref 3.5–5.0)
Alkaline Phosphatase: 58 U/L (ref 38–126)
Anion gap: 7 (ref 5–15)
BUN: 31 mg/dL — ABNORMAL HIGH (ref 8–23)
CO2: 27 mmol/L (ref 22–32)
Calcium: 9.9 mg/dL (ref 8.9–10.3)
Chloride: 105 mmol/L (ref 98–111)
Creatinine: 1.06 mg/dL (ref 0.61–1.24)
GFR, Estimated: >60 mL/min (ref >60)
Glucose, Bld: 98 mg/dL (ref 70–99)
Potassium: 4.2 mmol/L (ref 3.5–5.1)
Sodium: 139 mmol/L (ref 135–145)
Total Bilirubin: 0.8 mg/dL (ref 0.3–1.2)
Total Protein: 7.1 g/dL (ref 6.5–8.1)

## 2021-12-02 MED ORDER — LEUPROLIDE ACETATE (6 MONTH) 45 MG ~~LOC~~ KIT
45.0000 mg | PACK | Freq: Once | SUBCUTANEOUS | Status: AC
  Administered 2021-12-02: 09:00:00 45 mg via SUBCUTANEOUS
  Filled 2021-12-02: qty 45

## 2021-12-02 NOTE — Addendum Note (Signed)
Addended by: [name] on: 12/02/2021 08:56 AM   Modules accepted: Orders

## 2021-12-02 NOTE — Progress Notes (Addendum)
Hematology and Oncology Follow Up Visit  Frederick Cruz [id number] 29-Nov-1956 65 y.o. 12/02/2021   Principle Diagnosis:  Metastatic prostate cancer-castrate responsive  Current Therapy:   Eligard 45 mg subcu every 6 months-next dose in 05/2022 Casodex 50 mg p.o. daily     Interim History:  Frederick Cruz is back for follow-up.  He comes in with his wife.  He did have a nice Thanksgiving and Christmas.  Surprisingly, they have not been on a road trip.  The problem will go in the springtime.  His last PSA was less than 0.1.  He is really done well with the antiandrogen therapy.  He has had no problems with pain.  There is no change in bowel or bladder habits.  He has had no cough or shortness of breath.  He has had no bleeding.  There is no leg swelling.  He does have hot flashes.  I am not surprised by this since his testosterone is less than 3.  Currently, I would say his performance status is probably ECOG 0.     Medications:  Current Outpatient Medications:    atorvastatin (LIPITOR) 20 MG tablet, Take 20 mg by mouth daily., Disp: , Rfl:    bicalutamide (CASODEX) 50 MG tablet, TAKE 1 TABLET BY MOUTH EVERY DAY, Disp: 90 tablet, Rfl: 4   Calcium-Magnesium 300-300 MG TABS, Take by mouth daily., Disp: , Rfl:    Cholecalciferol 25 MCG (1000 UT) tablet, Take by mouth., Disp: , Rfl:    escitalopram (LEXAPRO) 10 MG tablet, Take 10 mg by mouth daily., Disp: , Rfl:    GLUTATHIONE PO, Take by mouth daily., Disp: , Rfl:    metoprolol succinate (TOPROL-XL) 25 MG 24 hr tablet, TAKE 1 TABLET (25 MG TOTAL) BY MOUTH DAILY. NEED OFFICE VISIT, Disp: 30 tablet, Rfl: 0   Multiple Vitamins-Minerals (CENTRUM SILVER 50+MEN PO), Take 1 tablet by mouth daily., Disp: , Rfl:    Omega-3 Fatty Acids (FISH OIL) 1000 MG CAPS, Take 1 capsule by mouth daily., Disp: , Rfl:    sildenafil (VIAGRA) 100 MG tablet, TAKE ONE TABLET BY MOUTH DAILY AS NEEDED FOR FOR ERECTILE DYSFUNCTION, Disp: 10 tablet, Rfl: 3    TURMERIC PO, Take by mouth daily., Disp: , Rfl:    vitamin C (ASCORBIC ACID) 500 MG tablet, Take 500 mg by mouth daily., Disp: , Rfl:    vitamin E 400 UNIT capsule, Take 400 Units by mouth daily., Disp: , Rfl:   Allergies:  Allergies  Allergen Reactions   Clonazepam Other (See Comments)    Double vision    Past Medical History, Surgical history, Social history, and Family History were reviewed and updated.  Review of Systems: Review of Systems  Constitutional: Negative.   HENT:  Negative.    Eyes: Negative.   Respiratory: Negative.    Cardiovascular: Negative.   Gastrointestinal: Negative.   Endocrine: Positive for hot flashes.  Genitourinary: Negative.    Musculoskeletal: Negative.   Skin: Negative.   Neurological: Negative.   Hematological: Negative.   Psychiatric/Behavioral: Negative.     Physical Exam:  weight is 233 lb 1.3 oz (105.7 kg). His oral temperature is 98.3 F (36.8 C). His blood pressure is 136/82 and his pulse is 61. His respiration is 20 and oxygen saturation is 96%.   Wt Readings from Last 3 Encounters:  12/02/21 233 lb 1.3 oz (105.7 kg)  08/31/21 232 lb (105.2 kg)  05/26/21 235 lb (106.6 kg)    Physical Exam Vitals reviewed.  HENT:     Head: Normocephalic and atraumatic.  Eyes:     Pupils: Pupils are equal, round, and reactive to light.  Cardiovascular:     Rate and Rhythm: Normal rate and regular rhythm.     Heart sounds: Normal heart sounds.  Pulmonary:     Effort: Pulmonary effort is normal.     Breath sounds: Normal breath sounds.  Abdominal:     General: Bowel sounds are normal.     Palpations: Abdomen is soft.  Musculoskeletal:        General: No tenderness or deformity. Normal range of motion.     Cervical back: Normal range of motion.  Lymphadenopathy:     Cervical: No cervical adenopathy.  Skin:    General: Skin is warm and dry.     Findings: No erythema or rash.  Neurological:     Mental Status: He is alert and oriented to  person, place, and time.  Psychiatric:        Behavior: Behavior normal.        Thought Content: Thought content normal.        Judgment: Judgment normal.     Lab Results  Component Value Date   WBC 3.8 (L) 08/31/2021   HGB 13.3 08/31/2021   HCT 39.1 08/31/2021   MCV 94.2 08/31/2021   PLT 174 08/31/2021     Chemistry      Component Value Date/Time   NA 143 08/31/2021 0810   K 4.4 08/31/2021 0810   CL 106 08/31/2021 0810   CO2 29 08/31/2021 0810   BUN 24 (H) 08/31/2021 0810   CREATININE 0.90 08/31/2021 0810      Component Value Date/Time   CALCIUM 9.7 08/31/2021 0810   ALKPHOS 52 08/31/2021 0810   AST 22 08/31/2021 0810   ALT 20 08/31/2021 0810   BILITOT 0.6 08/31/2021 0810       Impression and Plan: Frederick Cruz is a 65 year old white male.  He has metastatic prostate cancer.  He clearly has oligo metastatic disease.  I really think that everything is going nicely.  I really do not see that we have to do any scans on him.  I have to believe that he is got to be in remission by his PSA.  At this point, we will switch him to every 68-month treatment for the Eligard.  I think this would be very reasonable.  It would certainly make life easier for him.    I am just glad that his quality life is doing well.  His wife is quite pleased with how well he is doing.    [name], MD 12/29/20228:19 AM

## 2021-12-02 NOTE — Patient Instructions (Signed)
Leuprolide depot injection What is this medication? LEUPROLIDE (loo PROE lide) is a man-made protein that acts like a natural hormone in the body. It decreases testosterone in men and decreases estrogen in women. In men, this medicine is used to treat advanced prostate cancer. In women, some forms of this medicine may be used to treat endometriosis, uterine fibroids, or other male hormone-related problems. This medicine may be used for other purposes; ask your health care provider or pharmacist if you have questions. COMMON BRAND NAME(S): Eligard, Fensolv, Lupron Depot, Lupron Depot-Ped, Viadur What should I tell my care team before I take this medication? They need to know if you have any of these conditions: diabetes heart disease or previous heart attack high blood pressure high cholesterol mental illness osteoporosis pain or difficulty passing urine seizures spinal cord metastasis stroke suicidal thoughts, plans, or attempt; a previous suicide attempt by you or a family member tobacco smoker unusual vaginal bleeding (women) an unusual or allergic reaction to leuprolide, benzyl alcohol, other medicines, foods, dyes, or preservatives pregnant or trying to get pregnant breast-feeding How should I use this medication? This medicine is for injection into a muscle or for injection under the skin. It is given by a health care professional in a hospital or clinic setting. The specific product will determine how it will be given to you. Make sure you understand which product you receive and how often you will receive it. Talk to your pediatrician regarding the use of this medicine in children. Special care may be needed. Overdosage: If you think you have taken too much of this medicine contact a poison control center or emergency room at once. NOTE: This medicine is only for you. Do not share this medicine with others. What if I miss a dose? It is important not to miss a dose. Call your  doctor or health care professional if you are unable to keep an appointment. Depot injections: Depot injections are given either once-monthly, every 12 weeks, every 16 weeks, or every 24 weeks depending on the product you are prescribed. The product you are prescribed will be based on if you are male or male, and your condition. Make sure you understand your product and dosing. What may interact with this medication? Do not take this medicine with any of the following medications: chasteberry cisapride dronedarone pimozide thioridazine This medicine may also interact with the following medications: herbal or dietary supplements, like black cohosh or DHEA male hormones, like estrogens or progestins and birth control pills, patches, rings, or injections male hormones, like testosterone other medicines that prolong the QT interval (abnormal heart rhythm) This list may not describe all possible interactions. Give your health care provider a list of all the medicines, herbs, non-prescription drugs, or dietary supplements you use. Also tell them if you smoke, drink alcohol, or use illegal drugs. Some items may interact with your medicine. What should I watch for while using this medication? Visit your doctor or health care professional for regular checks on your progress. During the first weeks of treatment, your symptoms may get worse, but then will improve as you continue your treatment. You may get hot flashes, increased bone pain, increased difficulty passing urine, or an aggravation of nerve symptoms. Discuss these effects with your doctor or health care professional, some of them may improve with continued use of this medicine. Male patients may experience a menstrual cycle or spotting during the first months of therapy with this medicine. If this continues, contact your doctor or  health care professional. This medicine may increase blood sugar. Ask your healthcare provider if changes in diet  or medicines are needed if you have diabetes. What side effects may I notice from receiving this medication? Side effects that you should report to your doctor or health care professional as soon as possible: allergic reactions like skin rash, itching or hives, swelling of the face, lips, or tongue breathing problems chest pain depression or memory disorders pain in your legs or groin pain at site where injected or implanted seizures severe headache signs and symptoms of high blood sugar such as being more thirsty or hungry or having to urinate more than normal. You may also feel very tired or have blurry vision swelling of the feet and legs suicidal thoughts or other mood changes visual changes vomiting Side effects that usually do not require medical attention (report to your doctor or health care professional if they continue or are bothersome): breast swelling or tenderness decrease in sex drive or performance diarrhea hot flashes loss of appetite muscle, joint, or bone pains nausea redness or irritation at site where injected or implanted skin problems or acne This list may not describe all possible side effects. Call your doctor for medical advice about side effects. You may report side effects to FDA at 1-800-FDA-1088. Where should I keep my medication? This drug is given in a hospital or clinic and will not be stored at home. NOTE: This sheet is a summary. It may not cover all possible information. If you have questions about this medicine, talk to your doctor, pharmacist, or health care provider.  2022 Elsevier/Gold Standard (2021-08-10 00:00:00)

## 2021-12-03 ENCOUNTER — Telehealth: Payer: Self-pay | Admitting: Hematology & Oncology

## 2021-12-03 LAB — PSA, TOTAL AND FREE
PSA, Free Pct: UNDETERMINED %
PSA, Free: <0.02 ng/mL
Prostate Specific Ag, Serum: <0.1 ng/mL (ref 0.0–4.0)

## 2021-12-03 LAB — TESTOSTERONE: Testosterone: <3 ng/dL — ABNORMAL LOW (ref 264–916)

## 2021-12-03 NOTE — Telephone Encounter (Signed)
Contacted patient to schedule appt, per staff message, wife answered phone and scheduled appt 7/10. She advised that they will be out of town during the first week on July so she scheduled week after.

## 2021-12-28 ENCOUNTER — Other Ambulatory Visit: Payer: Self-pay | Admitting: Hematology & Oncology

## 2021-12-28 DIAGNOSIS — C775 Secondary and unspecified malignant neoplasm of intrapelvic lymph nodes: Secondary | ICD-10-CM

## 2021-12-28 DIAGNOSIS — C61 Malignant neoplasm of prostate: Secondary | ICD-10-CM

## 2021-12-28 DIAGNOSIS — N5231 Erectile dysfunction following radical prostatectomy: Secondary | ICD-10-CM

## 2022-06-13 ENCOUNTER — Ambulatory Visit: Payer: Medicare Other

## 2022-06-13 ENCOUNTER — Inpatient Hospital Stay: Payer: Medicare Other | Attending: Hematology & Oncology

## 2022-06-13 ENCOUNTER — Encounter: Payer: Self-pay | Admitting: Hematology & Oncology

## 2022-06-13 ENCOUNTER — Other Ambulatory Visit: Payer: Self-pay

## 2022-06-13 ENCOUNTER — Inpatient Hospital Stay (HOSPITAL_BASED_OUTPATIENT_CLINIC_OR_DEPARTMENT_OTHER): Payer: Medicare Other | Admitting: Hematology & Oncology

## 2022-06-13 VITALS — BP 143/75 | HR 56 | Temp 98.3°F | Resp 18 | Ht 69.0 in | Wt 232.0 lb

## 2022-06-13 DIAGNOSIS — C775 Secondary and unspecified malignant neoplasm of intrapelvic lymph nodes: Secondary | ICD-10-CM

## 2022-06-13 DIAGNOSIS — C61 Malignant neoplasm of prostate: Secondary | ICD-10-CM | POA: Diagnosis present

## 2022-06-13 DIAGNOSIS — Z5111 Encounter for antineoplastic chemotherapy: Secondary | ICD-10-CM | POA: Insufficient documentation

## 2022-06-13 LAB — CBC WITH DIFFERENTIAL (CANCER CENTER ONLY)
Abs Immature Granulocytes: 0.01 10*3/uL (ref 0.00–0.07)
Basophils Absolute: 0.1 10*3/uL (ref 0.0–0.1)
Basophils Relative: 2 %
Eosinophils Absolute: 0.2 10*3/uL (ref 0.0–0.5)
Eosinophils Relative: 4 %
HCT: 39 % (ref 39.0–52.0)
Hemoglobin: 13.3 g/dL (ref 13.0–17.0)
Immature Granulocytes: 0 %
Lymphocytes Relative: 44 %
Lymphs Abs: 1.8 10*3/uL (ref 0.7–4.0)
MCH: 32.3 pg (ref 26.0–34.0)
MCHC: 34.1 g/dL (ref 30.0–36.0)
MCV: 94.7 fL (ref 80.0–100.0)
Monocytes Absolute: 0.4 10*3/uL (ref 0.1–1.0)
Monocytes Relative: 9 %
Neutro Abs: 1.7 10*3/uL (ref 1.7–7.7)
Neutrophils Relative %: 41 %
Platelet Count: 179 10*3/uL (ref 150–400)
RBC: 4.12 MIL/uL — ABNORMAL LOW (ref 4.22–5.81)
RDW: 12.2 % (ref 11.5–15.5)
WBC Count: 4.1 10*3/uL (ref 4.0–10.5)
nRBC: 0 % (ref 0.0–0.2)

## 2022-06-13 LAB — CMP (CANCER CENTER ONLY)
ALT: 22 U/L (ref 0–44)
AST: 24 U/L (ref 15–41)
Albumin: 4.5 g/dL (ref 3.5–5.0)
Alkaline Phosphatase: 48 U/L (ref 38–126)
Anion gap: 9 (ref 5–15)
BUN: 25 mg/dL — ABNORMAL HIGH (ref 8–23)
CO2: 28 mmol/L (ref 22–32)
Calcium: 9.4 mg/dL (ref 8.9–10.3)
Chloride: 105 mmol/L (ref 98–111)
Creatinine: 1.02 mg/dL (ref 0.61–1.24)
GFR, Estimated: >60 mL/min (ref >60)
Glucose, Bld: 116 mg/dL — ABNORMAL HIGH (ref 70–99)
Potassium: 4.3 mmol/L (ref 3.5–5.1)
Sodium: 142 mmol/L (ref 135–145)
Total Bilirubin: 0.4 mg/dL (ref 0.3–1.2)
Total Protein: 6.7 g/dL (ref 6.5–8.1)

## 2022-06-13 NOTE — Progress Notes (Signed)
Hematology and Oncology Follow Up Visit  Frederick Cruz [id number] [birth year] 66 y.o. 06/13/2022   Principle Diagnosis:  Metastatic prostate cancer-castrate responsive  Current Therapy:   Eligard 45 mg subcu every 6 months-next dose in 11/2022 Casodex 50 mg p.o. daily     Interim History:  Frederick Cruz is back for follow-up.  He comes in with his wife.  As always, they have been on vacation.  They go camping.  They just got back from [place], [place].  There will be heading up to [place] at the end of the month.  He feels well.  He has been out in the sun quite a bit.  I am unsure if he is using a lot of sunscreen.  His last PSA was less than 0.1.  As such, he still is in remission.  He has had no bony pain.  His only pain has been with the left knee.  He gets this it drained and injected with cortisone.  It sounds like he may need to have surgery.  He has had no change in bowel or bladder habits.  There has been no bleeding.  He has had no cough or shortness of breath.  Overall, I would say his performance status is probably ECOG 0.     Medications:  Current Outpatient Medications:    atorvastatin (LIPITOR) 20 MG tablet, Take 20 mg by mouth daily., Disp: , Rfl:    bicalutamide (CASODEX) 50 MG tablet, TAKE 1 TABLET BY MOUTH EVERY DAY, Disp: 90 tablet, Rfl: 4   Calcium-Magnesium 300-300 MG TABS, Take by mouth daily., Disp: , Rfl:    Cholecalciferol 25 MCG (1000 UT) tablet, Take by mouth., Disp: , Rfl:    escitalopram (LEXAPRO) 10 MG tablet, Take 10 mg by mouth daily., Disp: , Rfl:    GLUTATHIONE PO, Take by mouth daily., Disp: , Rfl:    metoprolol succinate (TOPROL-XL) 25 MG 24 hr tablet, TAKE 1 TABLET (25 MG TOTAL) BY MOUTH DAILY. NEED OFFICE VISIT, Disp: 30 tablet, Rfl: 0   Multiple Vitamins-Minerals (CENTRUM SILVER 50+MEN PO), Take 1 tablet by mouth daily., Disp: , Rfl:    Omega-3 Fatty Acids (FISH OIL) 1000 MG CAPS, Take 1 capsule by mouth daily., Disp: , Rfl:     sildenafil (VIAGRA) 100 MG tablet, TAKE ONE TABLET BY MOUTH DAILY AS NEEDED FOR FOR ERECTILE DYSFUNCTION, Disp: 10 tablet, Rfl: 3   TURMERIC PO, Take by mouth daily., Disp: , Rfl:    vitamin C (ASCORBIC ACID) 500 MG tablet, Take 500 mg by mouth daily., Disp: , Rfl:    vitamin E 400 UNIT capsule, Take 400 Units by mouth daily., Disp: , Rfl:   Allergies:  Allergies  Allergen Reactions   Clonazepam Other (See Comments)    Double vision   Trazodone Other (See Comments)    Past Medical History, Surgical history, Social history, and Family History were reviewed and updated.  Review of Systems: Review of Systems  Constitutional: Negative.   HENT:  Negative.    Eyes: Negative.   Respiratory: Negative.    Cardiovascular: Negative.   Gastrointestinal: Negative.   Endocrine: Positive for hot flashes.  Genitourinary: Negative.    Musculoskeletal: Negative.   Skin: Negative.   Neurological: Negative.   Hematological: Negative.   Psychiatric/Behavioral: Negative.      Physical Exam:  height is '5\' 9"'$  (1.753 m) and weight is 232 lb (105.2 kg). His oral temperature is 98.3 F (36.8 C). His blood pressure is 143/75 (abnormal) and  his pulse is 56 (abnormal). His respiration is 18 and oxygen saturation is 96%.   Wt Readings from Last 3 Encounters:  06/13/22 232 lb (105.2 kg)  12/02/21 233 lb 1.3 oz (105.7 kg)  08/31/21 232 lb (105.2 kg)    Physical Exam Vitals reviewed.  HENT:     Head: Normocephalic and atraumatic.  Eyes:     Pupils: Pupils are equal, round, and reactive to light.  Cardiovascular:     Rate and Rhythm: Normal rate and regular rhythm.     Heart sounds: Normal heart sounds.  Pulmonary:     Effort: Pulmonary effort is normal.     Breath sounds: Normal breath sounds.  Abdominal:     General: Bowel sounds are normal.     Palpations: Abdomen is soft.  Musculoskeletal:        General: No tenderness or deformity. Normal range of motion.     Cervical back: Normal range  of motion.  Lymphadenopathy:     Cervical: No cervical adenopathy.  Skin:    General: Skin is warm and dry.     Findings: No erythema or rash.  Neurological:     Mental Status: He is alert and oriented to person, place, and time.  Psychiatric:        Behavior: Behavior normal.        Thought Content: Thought content normal.        Judgment: Judgment normal.      Lab Results  Component Value Date   WBC 4.1 06/13/2022   HGB 13.3 06/13/2022   HCT 39.0 06/13/2022   MCV 94.7 06/13/2022   PLT 179 06/13/2022     Chemistry      Component Value Date/Time   NA 139 12/02/2021 0758   K 4.2 12/02/2021 0758   CL 105 12/02/2021 0758   CO2 27 12/02/2021 0758   BUN 31 (H) 12/02/2021 0758   CREATININE 1.06 12/02/2021 0758      Component Value Date/Time   CALCIUM 9.9 12/02/2021 0758   ALKPHOS 58 12/02/2021 0758   AST 20 12/02/2021 0758   ALT 15 12/02/2021 0758   BILITOT 0.8 12/02/2021 0758       Impression and Plan: Frederick Cruz is a 66 year old white male.  He has metastatic prostate cancer.  He clearly has oligo metastatic disease.  So far, I think everything is going quite well.  I think that as long as his PSA is undetectable, we do not have to put him through any kind of scans.  As always, we will plan to get him back in 6 months.  He does need any surgery for his left knee, I do not see a problem with him having this.  [name], MD 7/10/20238:21 AM

## 2022-06-14 ENCOUNTER — Encounter: Payer: Self-pay | Admitting: *Deleted

## 2022-06-14 ENCOUNTER — Telehealth: Payer: Self-pay

## 2022-06-14 LAB — PSA, TOTAL AND FREE
PSA, Free Pct: UNDETERMINED %
PSA, Free: <0.02 ng/mL
Prostate Specific Ag, Serum: <0.1 ng/mL (ref 0.0–4.0)

## 2022-06-14 LAB — TESTOSTERONE: Testosterone: <3 ng/dL — ABNORMAL LOW (ref 264–916)

## 2022-06-14 NOTE — Telephone Encounter (Signed)
-----   Message from [name], MD sent at 06/14/2022  7:26 AM EDT ----- Call - PSA is still not detectable.  [name]

## 2022-06-16 ENCOUNTER — Inpatient Hospital Stay: Payer: Medicare Other

## 2022-06-16 VITALS — BP 134/92 | HR 50 | Temp 97.9°F | Resp 18

## 2022-06-16 DIAGNOSIS — Z5111 Encounter for antineoplastic chemotherapy: Secondary | ICD-10-CM | POA: Diagnosis not present

## 2022-06-16 DIAGNOSIS — C775 Secondary and unspecified malignant neoplasm of intrapelvic lymph nodes: Secondary | ICD-10-CM

## 2022-06-16 MED ORDER — LEUPROLIDE ACETATE (6 MONTH) 45 MG ~~LOC~~ KIT
45.0000 mg | PACK | Freq: Once | SUBCUTANEOUS | Status: AC
  Administered 2022-06-16: 45 mg via SUBCUTANEOUS
  Filled 2022-06-16: qty 45

## 2022-06-16 NOTE — Patient Instructions (Signed)
[facility]  Discharge Instructions: Thank you for choosing [facility] to provide your oncology and hematology care.   If you have a lab appointment with the [facility], please go directly to the [facility] and check in at the registration area.  Wear comfortable clothing and clothing appropriate for easy access to any Portacath or PICC line.   We strive to give you quality time with your provider. You may need to reschedule your appointment if you arrive late (15 or more minutes).  Arriving late affects you and other patients whose appointments are after yours.  Also, if you miss three or more appointments without notifying the office, you may be dismissed from the clinic at the provider's discretion.      For prescription refill requests, have your pharmacy contact our office and allow 72 hours for refills to be completed.    Today you received the following chemotherapy and/or immunotherapy agents Eligard.   To help prevent nausea and vomiting after your treatment, we encourage you to take your nausea medication as directed.  BELOW ARE SYMPTOMS THAT SHOULD BE REPORTED IMMEDIATELY: *FEVER GREATER THAN 100.4 F (38 C) OR HIGHER *CHILLS OR SWEATING *NAUSEA AND VOMITING THAT IS NOT CONTROLLED WITH YOUR NAUSEA MEDICATION *UNUSUAL SHORTNESS OF BREATH *UNUSUAL BRUISING OR BLEEDING *URINARY PROBLEMS (pain or burning when urinating, or frequent urination) *BOWEL PROBLEMS (unusual diarrhea, constipation, pain near the anus) TENDERNESS IN MOUTH AND THROAT WITH OR WITHOUT PRESENCE OF ULCERS (sore throat, sores in mouth, or a toothache) UNUSUAL RASH, SWELLING OR PAIN  UNUSUAL VAGINAL DISCHARGE OR ITCHING   Items with * indicate a potential emergency and should be followed up as soon as possible or go to the Emergency Department if any problems should occur.  Please show the CHEMOTHERAPY ALERT CARD or IMMUNOTHERAPY ALERT CARD at check-in to the  Emergency Department and triage nurse. Should you have questions after your visit or need to cancel or reschedule your appointment, please contact [facility]  [phone] and follow the prompts.  Office hours are 8:00 a.m. to 4:30 p.m. Monday - Friday. Please note that voicemails left after 4:00 p.m. may not be returned until the following business day.  We are closed weekends and major holidays. You have access to a nurse at all times for urgent questions. Please call the main number to the clinic [phone] and follow the prompts.  For any non-urgent questions, you may also contact your provider using MyChart. We now offer e-Visits for anyone 44 and older to request care online for non-urgent symptoms. For details visit [email or web address].   Also download the MyChart app! Go to the app store, search "MyChart", open the app, select [facility], and log in with your MyChart username and password.  Masks are optional in the cancer centers. If you would like for your care team to wear a mask while they are taking care of you, please let them know. For doctor visits, patients may have with them one support person who is at least 68 years old. At this time, visitors are not allowed in the infusion area.

## 2022-07-06 ENCOUNTER — Other Ambulatory Visit: Payer: Self-pay | Admitting: Hematology & Oncology

## 2022-07-06 DIAGNOSIS — N5231 Erectile dysfunction following radical prostatectomy: Secondary | ICD-10-CM

## 2022-07-06 DIAGNOSIS — C61 Malignant neoplasm of prostate: Secondary | ICD-10-CM

## 2022-11-14 ENCOUNTER — Encounter: Payer: Self-pay | Admitting: Hematology & Oncology

## 2022-11-14 ENCOUNTER — Inpatient Hospital Stay: Payer: Medicare Other | Attending: Hematology & Oncology

## 2022-11-14 ENCOUNTER — Inpatient Hospital Stay: Payer: Medicare Other

## 2022-11-14 ENCOUNTER — Other Ambulatory Visit: Payer: Self-pay

## 2022-11-14 ENCOUNTER — Inpatient Hospital Stay (HOSPITAL_BASED_OUTPATIENT_CLINIC_OR_DEPARTMENT_OTHER): Payer: Medicare Other | Admitting: Hematology & Oncology

## 2022-11-14 VITALS — BP 132/80 | HR 52 | Temp 97.6°F | Resp 18 | Ht 69.0 in | Wt 240.4 lb

## 2022-11-14 DIAGNOSIS — C61 Malignant neoplasm of prostate: Secondary | ICD-10-CM

## 2022-11-14 DIAGNOSIS — Z79899 Other long term (current) drug therapy: Secondary | ICD-10-CM | POA: Insufficient documentation

## 2022-11-14 DIAGNOSIS — C775 Secondary and unspecified malignant neoplasm of intrapelvic lymph nodes: Secondary | ICD-10-CM

## 2022-11-14 DIAGNOSIS — Z5111 Encounter for antineoplastic chemotherapy: Secondary | ICD-10-CM | POA: Diagnosis present

## 2022-11-14 LAB — CBC WITH DIFFERENTIAL (CANCER CENTER ONLY)
Abs Immature Granulocytes: 0.01 10*3/uL (ref 0.00–0.07)
Basophils Absolute: 0.1 10*3/uL (ref 0.0–0.1)
Basophils Relative: 1 %
Eosinophils Absolute: 0.2 10*3/uL (ref 0.0–0.5)
Eosinophils Relative: 5 %
HCT: 39 % (ref 39.0–52.0)
Hemoglobin: 13.2 g/dL (ref 13.0–17.0)
Immature Granulocytes: 0 %
Lymphocytes Relative: 38 %
Lymphs Abs: 1.7 10*3/uL (ref 0.7–4.0)
MCH: 31.7 pg (ref 26.0–34.0)
MCHC: 33.8 g/dL (ref 30.0–36.0)
MCV: 93.5 fL (ref 80.0–100.0)
Monocytes Absolute: 0.5 10*3/uL (ref 0.1–1.0)
Monocytes Relative: 11 %
Neutro Abs: 2 10*3/uL (ref 1.7–7.7)
Neutrophils Relative %: 45 %
Platelet Count: 170 10*3/uL (ref 150–400)
RBC: 4.17 MIL/uL — ABNORMAL LOW (ref 4.22–5.81)
RDW: 12.1 % (ref 11.5–15.5)
WBC Count: 4.4 10*3/uL (ref 4.0–10.5)
nRBC: 0 % (ref 0.0–0.2)

## 2022-11-14 LAB — CMP (CANCER CENTER ONLY)
ALT: 16 U/L (ref 0–44)
AST: 20 U/L (ref 15–41)
Albumin: 4.5 g/dL (ref 3.5–5.0)
Alkaline Phosphatase: 47 U/L (ref 38–126)
Anion gap: 7 (ref 5–15)
BUN: 19 mg/dL (ref 8–23)
CO2: 30 mmol/L (ref 22–32)
Calcium: 9.4 mg/dL (ref 8.9–10.3)
Chloride: 106 mmol/L (ref 98–111)
Creatinine: 0.96 mg/dL (ref 0.61–1.24)
GFR, Estimated: >60 mL/min (ref >60)
Glucose, Bld: 106 mg/dL — ABNORMAL HIGH (ref 70–99)
Potassium: 4.5 mmol/L (ref 3.5–5.1)
Sodium: 143 mmol/L (ref 135–145)
Total Bilirubin: 0.5 mg/dL (ref 0.3–1.2)
Total Protein: 7 g/dL (ref 6.5–8.1)

## 2022-11-14 MED ORDER — LEUPROLIDE ACETATE (6 MONTH) 45 MG ~~LOC~~ KIT
45.0000 mg | PACK | Freq: Once | SUBCUTANEOUS | Status: AC
  Administered 2022-11-14: 45 mg via SUBCUTANEOUS
  Filled 2022-11-14: qty 45

## 2022-11-14 NOTE — Patient Instructions (Signed)
Leuprolide Suspension for Injection (Prostate Cancer) What is this medication? LEUPROLIDE (loo PROE lide) reduces the symptoms of prostate cancer. It works by decreasing levels of the hormone testosterone in the body. This prevents prostate cancer cells from spreading or growing. This medicine may be used for other purposes; ask your health care provider or pharmacist if you have questions. COMMON BRAND NAME(S): Eligard, Lupron Depot, Lupron Depot-Ped, Lutrate Depot, Viadur What should I tell my care team before I take this medication? They need to know if you have any of these conditions: Diabetes Heart disease Heart failure High or low levels of electrolytes, such as magnesium, potassium, or sodium in your blood Irregular heartbeat or rhythm Seizures An unusual or allergic reaction to leuprolide, other medications, foods, dyes, or preservatives Pregnant or trying to get pregnant Breast-feeding How should I use this medication? This medication is injected under the skin or into a muscle. It is given by your care team in a hospital or clinic setting. Talk to your care team about the use of this medication in children. Special care may be needed. Overdosage: If you think you have taken too much of this medicine contact a poison control center or emergency room at once. NOTE: This medicine is only for you. Do not share this medicine with others. What if I miss a dose? Keep appointments for follow-up doses. It is important not to miss your dose. Call your care team if you are unable to keep an appointment. What may interact with this medication? Do not take this medication with any of the following: Cisapride Dronedarone Ketoconazole Levoketoconazole Pimozide Thioridazine This medication may also interact with the following: Other medications that cause heart rhythm changes This list may not describe all possible interactions. Give your health care provider a list of all the medicines,  herbs, non-prescription drugs, or dietary supplements you use. Also tell them if you smoke, drink alcohol, or use illegal drugs. Some items may interact with your medicine. What should I watch for while using this medication? Visit your care team for regular checks on your progress. Tell your care team if your symptoms do not start to get better or if they get worse. This medication may increase blood sugar. The risk may be higher in patients who already have diabetes. Ask your care team what you can do to lower the risk of diabetes while taking this medication. This medication may cause infertility. Talk to your care team if you are concerned about your fertility. Heart attacks and strokes have been reported with the use of this medication. Get emergency help if you develop signs or symptoms of a heart attack or stroke. Talk to your care team about the risks and benefits of this medication. What side effects may I notice from receiving this medication? Side effects that you should report to your care team as soon as possible: Allergic reactions--skin rash, itching, hives, swelling of the face, lips, tongue, or throat Heart attack--pain or tightness in the chest, shoulders, arms, or jaw, nausea, shortness of breath, cold or clammy skin, feeling faint or lightheaded Heart rhythm changes--fast or irregular heartbeat, dizziness, feeling faint or lightheaded, chest pain, trouble breathing High blood sugar (hyperglycemia)--increased thirst or amount of urine, unusual weakness or fatigue, blurry vision Mood swings, irritability, hostility Seizures Stroke--sudden numbness or weakness of the face, arm, or leg, trouble speaking, confusion, trouble walking, loss of balance or coordination, dizziness, severe headache, change in vision Thoughts of suicide or self-harm, worsening mood, feelings of depression Side   effects that usually do not require medical attention (report to your care team if they continue or  are bothersome): Bone pain Change in sex drive or performance General discomfort and fatigue Hot flashes Muscle pain Pain, redness, or irritation at injection site Swelling of the ankles, hands, or feet This list may not describe all possible side effects. Call your doctor for medical advice about side effects. You may report side effects to FDA at 1-800-FDA-1088. Where should I keep my medication? This medication is given in a hospital or clinic. It will not be stored at home. NOTE: This sheet is a summary. It may not cover all possible information. If you have questions about this medicine, talk to your doctor, pharmacist, or health care provider.  2023 Elsevier/Gold Standard (2022-02-02 00:00:00)  

## 2022-11-14 NOTE — Progress Notes (Signed)
Hematology and Oncology Follow Up Visit  HOMER MILLER [id number] 1956/01/12 66 y.o. 11/14/2022   Principle Diagnosis:  Metastatic prostate cancer-castrate responsive  Current Therapy:   Eligard 45 mg subcu every 6 months-next dose in 05/2023 Casodex 50 mg p.o. daily     Interim History:  Mr. Hedeen is back for follow-up.  We see him every 6 months.  He has been doing well.  As always, he and his wife go camping.  They were up in [place].  They were up in [place].  They always have a good time camping.  They just got themselves a new [employer], community.  I am sure that she will go with them whenever they travel.  He is done incredibly well with the Eligard and Casodex.  His last PSA was less than 0.1.  His last testosterone was less than 3.  He has had no problems with nausea or vomiting.  He has had no change in bowel or bladder habits.  He has had no issues with his joints.  He does have some arthritis in the left knee.  He has had no rashes.  There is been no bleeding.  He has had no cough.  He has had no problems with COVID.  Overall, I would say his performance status is ECOG 0.   Medications:  Current Outpatient Medications:    atorvastatin (LIPITOR) 20 MG tablet, Take 20 mg by mouth daily., Disp: , Rfl:    bicalutamide (CASODEX) 50 MG tablet, TAKE 1 TABLET BY MOUTH EVERY DAY, Disp: 90 tablet, Rfl: 4   Calcium-Magnesium 300-300 MG TABS, Take by mouth daily., Disp: , Rfl:    Cholecalciferol 25 MCG (1000 UT) tablet, Take by mouth., Disp: , Rfl:    escitalopram (LEXAPRO) 10 MG tablet, Take 10 mg by mouth daily., Disp: , Rfl:    GLUTATHIONE PO, Take by mouth daily., Disp: , Rfl:    metoprolol succinate (TOPROL-XL) 25 MG 24 hr tablet, TAKE 1 TABLET (25 MG TOTAL) BY MOUTH DAILY. NEED OFFICE VISIT, Disp: 30 tablet, Rfl: 0   Multiple Vitamins-Minerals (CENTRUM SILVER 50+MEN PO), Take 1 tablet by mouth daily., Disp: , Rfl:    Omega-3 Fatty Acids (FISH OIL) 1000 MG CAPS,  Take 1 capsule by mouth daily., Disp: , Rfl:    sildenafil (VIAGRA) 100 MG tablet, TAKE ONE TABLET BY MOUTH DAILY AS NEEDED FOR ERECTILE DYSFUNCTION, Disp: 10 tablet, Rfl: 3   TURMERIC PO, Take by mouth daily., Disp: , Rfl:    vitamin C (ASCORBIC ACID) 500 MG tablet, Take 500 mg by mouth daily., Disp: , Rfl:    vitamin E 400 UNIT capsule, Take 400 Units by mouth daily., Disp: , Rfl:   Allergies:  Allergies  Allergen Reactions   Clonazepam Other (See Comments)    Double vision   Trazodone Other (See Comments)    Patient doesn't remember    Past Medical History, Surgical history, Social history, and Family History were reviewed and updated.  Review of Systems: Review of Systems  Constitutional: Negative.   HENT:  Negative.    Eyes: Negative.   Respiratory: Negative.    Cardiovascular: Negative.   Gastrointestinal: Negative.   Endocrine: Positive for hot flashes.  Genitourinary: Negative.    Musculoskeletal: Negative.   Skin: Negative.   Neurological: Negative.   Hematological: Negative.   Psychiatric/Behavioral: Negative.      Physical Exam:  height is '5\' 9"'$  (1.753 m) and weight is 240 lb 6.4 oz (109 kg). His  oral temperature is 97.6 F (36.4 C). His blood pressure is 132/80 and his pulse is 52 (abnormal). His respiration is 18 and oxygen saturation is 100%.   Wt Readings from Last 3 Encounters:  11/14/22 240 lb 6.4 oz (109 kg)  06/13/22 232 lb (105.2 kg)  12/02/21 233 lb 1.3 oz (105.7 kg)    Physical Exam Vitals reviewed.  HENT:     Head: Normocephalic and atraumatic.  Eyes:     Pupils: Pupils are equal, round, and reactive to light.  Cardiovascular:     Rate and Rhythm: Normal rate and regular rhythm.     Heart sounds: Normal heart sounds.  Pulmonary:     Effort: Pulmonary effort is normal.     Breath sounds: Normal breath sounds.  Abdominal:     General: Bowel sounds are normal.     Palpations: Abdomen is soft.  Musculoskeletal:        General: No  tenderness or deformity. Normal range of motion.     Cervical back: Normal range of motion.  Lymphadenopathy:     Cervical: No cervical adenopathy.  Skin:    General: Skin is warm and dry.     Findings: No erythema or rash.  Neurological:     Mental Status: He is alert and oriented to person, place, and time.  Psychiatric:        Behavior: Behavior normal.        Thought Content: Thought content normal.        Judgment: Judgment normal.      Lab Results  Component Value Date   WBC 4.4 11/14/2022   HGB 13.2 11/14/2022   HCT 39.0 11/14/2022   MCV 93.5 11/14/2022   PLT 170 11/14/2022     Chemistry      Component Value Date/Time   NA 143 11/14/2022 0859   K 4.5 11/14/2022 0859   CL 106 11/14/2022 0859   CO2 30 11/14/2022 0859   BUN 19 11/14/2022 0859   CREATININE 0.96 11/14/2022 0859      Component Value Date/Time   CALCIUM 9.4 11/14/2022 0859   ALKPHOS 47 11/14/2022 0859   AST 20 11/14/2022 0859   ALT 16 11/14/2022 0859   BILITOT 0.5 11/14/2022 0859       Impression and Plan: Mr. Tuckey is a 66 year old white male.  He has metastatic prostate cancer.  He clearly has oligo metastatic disease.  So far, I think everything is going quite well.  I think that as long as his PSA is undetectable, we do not have to put him through any kind of scans.  As always, we will plan to get him back in 6 months.  I am so happy that he got the puppy.  I am sure that she will clearly enjoy all the camping that they do.  [name], MD 12/11/202310:07 AM

## 2022-11-15 LAB — PSA, TOTAL AND FREE
PSA, Free Pct: >40 %
PSA, Free: 0.04 ng/mL
Prostate Specific Ag, Serum: <0.1 ng/mL (ref 0.0–4.0)

## 2022-11-15 LAB — TESTOSTERONE: Testosterone: 56 ng/dL — ABNORMAL LOW (ref 264–916)

## 2023-01-01 ENCOUNTER — Other Ambulatory Visit: Payer: Self-pay | Admitting: Hematology & Oncology

## 2023-01-25 ENCOUNTER — Other Ambulatory Visit: Payer: Self-pay | Admitting: Hematology & Oncology

## 2023-01-25 DIAGNOSIS — C61 Malignant neoplasm of prostate: Secondary | ICD-10-CM

## 2023-01-25 DIAGNOSIS — N5231 Erectile dysfunction following radical prostatectomy: Secondary | ICD-10-CM

## 2023-03-10 ENCOUNTER — Telehealth: Payer: Self-pay | Admitting: *Deleted

## 2023-03-10 NOTE — Telephone Encounter (Signed)
Message received from patient's wife stating that pt has been fatigued over the past week and would like to know what to do.  Call placed back to patient's wife, Andrey Campanile and Andrey Campanile states that pt has had increased fatigue this week.  She states that pt had a possible fever on Sunday night with chills which subsided in the morning.  No SOB, no pain and no other complaints at this time.  Sandy instructed to contact pt.'s PCP to have pt assessed.  Andrey Campanile states that she will call pt.'s PCP now and is appreciative of call back.

## 2023-05-09 ENCOUNTER — Encounter: Payer: Self-pay | Admitting: Hematology & Oncology

## 2023-05-16 ENCOUNTER — Encounter: Payer: Self-pay | Admitting: Hematology & Oncology

## 2023-05-16 ENCOUNTER — Other Ambulatory Visit: Payer: Self-pay

## 2023-05-16 ENCOUNTER — Inpatient Hospital Stay: Payer: Medicare Other | Attending: Hematology & Oncology

## 2023-05-16 ENCOUNTER — Inpatient Hospital Stay: Payer: Medicare Other

## 2023-05-16 ENCOUNTER — Inpatient Hospital Stay (HOSPITAL_BASED_OUTPATIENT_CLINIC_OR_DEPARTMENT_OTHER): Payer: Medicare Other | Admitting: Hematology & Oncology

## 2023-05-16 VITALS — BP 142/76 | HR 51 | Temp 98.8°F | Resp 16 | Ht 69.0 in | Wt 223.0 lb

## 2023-05-16 DIAGNOSIS — C61 Malignant neoplasm of prostate: Secondary | ICD-10-CM

## 2023-05-16 DIAGNOSIS — C775 Secondary and unspecified malignant neoplasm of intrapelvic lymph nodes: Secondary | ICD-10-CM | POA: Diagnosis not present

## 2023-05-16 DIAGNOSIS — Z79899 Other long term (current) drug therapy: Secondary | ICD-10-CM | POA: Insufficient documentation

## 2023-05-16 DIAGNOSIS — M8588 Other specified disorders of bone density and structure, other site: Secondary | ICD-10-CM | POA: Diagnosis not present

## 2023-05-16 DIAGNOSIS — Z5111 Encounter for antineoplastic chemotherapy: Secondary | ICD-10-CM | POA: Insufficient documentation

## 2023-05-16 LAB — CBC WITH DIFFERENTIAL (CANCER CENTER ONLY)
Abs Immature Granulocytes: 0.01 10*3/uL (ref 0.00–0.07)
Basophils Absolute: 0.1 10*3/uL (ref 0.0–0.1)
Basophils Relative: 1 %
Eosinophils Absolute: 0.2 10*3/uL (ref 0.0–0.5)
Eosinophils Relative: 3 %
HCT: 39.7 % (ref 39.0–52.0)
Hemoglobin: 13.5 g/dL (ref 13.0–17.0)
Immature Granulocytes: 0 %
Lymphocytes Relative: 41 %
Lymphs Abs: 2.2 10*3/uL (ref 0.7–4.0)
MCH: 32 pg (ref 26.0–34.0)
MCHC: 34 g/dL (ref 30.0–36.0)
MCV: 94.1 fL (ref 80.0–100.0)
Monocytes Absolute: 0.6 10*3/uL (ref 0.1–1.0)
Monocytes Relative: 11 %
Neutro Abs: 2.4 10*3/uL (ref 1.7–7.7)
Neutrophils Relative %: 44 %
Platelet Count: 164 10*3/uL (ref 150–400)
RBC: 4.22 MIL/uL (ref 4.22–5.81)
RDW: 12.3 % (ref 11.5–15.5)
WBC Count: 5.4 10*3/uL (ref 4.0–10.5)
nRBC: 0 % (ref 0.0–0.2)

## 2023-05-16 LAB — CMP (CANCER CENTER ONLY)
ALT: 18 U/L (ref 0–44)
AST: 21 U/L (ref 15–41)
Albumin: 4.7 g/dL (ref 3.5–5.0)
Alkaline Phosphatase: 52 U/L (ref 38–126)
Anion gap: 8 (ref 5–15)
BUN: 21 mg/dL (ref 8–23)
CO2: 28 mmol/L (ref 22–32)
Calcium: 9.7 mg/dL (ref 8.9–10.3)
Chloride: 107 mmol/L (ref 98–111)
Creatinine: 0.92 mg/dL (ref 0.61–1.24)
GFR, Estimated: >60 mL/min (ref >60)
Glucose, Bld: 93 mg/dL (ref 70–99)
Potassium: 4.5 mmol/L (ref 3.5–5.1)
Sodium: 143 mmol/L (ref 135–145)
Total Bilirubin: 0.5 mg/dL (ref 0.3–1.2)
Total Protein: 7.2 g/dL (ref 6.5–8.1)

## 2023-05-16 MED ORDER — LEUPROLIDE ACETATE (6 MONTH) 45 MG ~~LOC~~ KIT
45.0000 mg | PACK | Freq: Once | SUBCUTANEOUS | Status: AC
  Administered 2023-05-16: 45 mg via SUBCUTANEOUS
  Filled 2023-05-16: qty 45

## 2023-05-16 NOTE — Progress Notes (Signed)
Hematology and Oncology Follow Up Visit  Frederick Cruz [id number] Apr 01, 1956 67 y.o. 05/16/2023   Principle Diagnosis:  Metastatic prostate cancer-castrate responsive  Current Therapy:   Eligard 45 mg subcu every 6 months-next dose in 05/2023 Casodex 50 mg p.o. daily     Interim History:  Frederick Cruz is back for follow-up.  He actually is doing quite nicely.  He and his wife, as always, will be going on another camping trip.  They are going up to [place].  Actually, the big trip will be up to [place] I think in August.  I know they will have a wonderful time.  He has had no real complaints.  He has had no problems with cough or shortness of breath.  He does have issues with hot flashes.  I am not surprised as his testosterone level is quite low.  His last PSA was less than 0.1.  He has had no nausea or vomiting.  There is no change in bowel or bladder habits.  He has had no rashes.  Is been no bleeding.  He has had no problems with COVID.  Overall, I would say that his performance status is probably ECOG 0.    Medications:  Current Outpatient Medications:    atorvastatin (LIPITOR) 20 MG tablet, Take 20 mg by mouth daily., Disp: , Rfl:    bicalutamide (CASODEX) 50 MG tablet, TAKE 1 TABLET BY MOUTH EVERY DAY, Disp: 90 tablet, Rfl: 4   Calcium-Magnesium 300-300 MG TABS, Take by mouth daily., Disp: , Rfl:    Cholecalciferol 25 MCG (1000 UT) tablet, Take by mouth., Disp: , Rfl:    escitalopram (LEXAPRO) 10 MG tablet, Take 10 mg by mouth daily., Disp: , Rfl:    GLUTATHIONE PO, Take by mouth daily., Disp: , Rfl:    metoprolol succinate (TOPROL-XL) 25 MG 24 hr tablet, TAKE 1 TABLET (25 MG TOTAL) BY MOUTH DAILY. NEED OFFICE VISIT, Disp: 30 tablet, Rfl: 0   Multiple Vitamins-Minerals (CENTRUM SILVER 50+MEN PO), Take 1 tablet by mouth daily., Disp: , Rfl:    Omega-3 Fatty Acids (FISH OIL) 1000 MG CAPS, Take 1 capsule by mouth daily., Disp: , Rfl:    sildenafil (VIAGRA) 100 MG tablet, TAKE  1 TABLET BY MOUTH DAILY AS NEEDED FOR ERECTILE DYSFUNCTION, Disp: 10 tablet, Rfl: 3   TURMERIC PO, Take by mouth daily., Disp: , Rfl:    vitamin C (ASCORBIC ACID) 500 MG tablet, Take 500 mg by mouth daily., Disp: , Rfl:    vitamin E 400 UNIT capsule, Take 400 Units by mouth daily., Disp: , Rfl:   Allergies:  Allergies  Allergen Reactions   Clonazepam Other (See Comments)    Double vision   Trazodone Other (See Comments)    Patient doesn't remember    Past Medical History, Surgical history, Social history, and Family History were reviewed and updated.  Review of Systems: Review of Systems  Constitutional: Negative.   HENT:  Negative.    Eyes: Negative.   Respiratory: Negative.    Cardiovascular: Negative.   Gastrointestinal: Negative.   Endocrine: Positive for hot flashes.  Genitourinary: Negative.    Musculoskeletal: Negative.   Skin: Negative.   Neurological: Negative.   Hematological: Negative.   Psychiatric/Behavioral: Negative.      Physical Exam:  height is 5\' 9"  (1.753 m) and weight is 223 lb (101.2 kg). His oral temperature is 98.8 F (37.1 C). His blood pressure is 142/76 (abnormal) and his pulse is 51 (abnormal). His respiration is  16 and oxygen saturation is 100%.   Wt Readings from Last 3 Encounters:  05/16/23 223 lb (101.2 kg)  11/14/22 240 lb 6.4 oz (109 kg)  06/13/22 232 lb (105.2 kg)    Physical Exam Vitals reviewed.  HENT:     Head: Normocephalic and atraumatic.  Eyes:     Pupils: Pupils are equal, round, and reactive to light.  Cardiovascular:     Rate and Rhythm: Normal rate and regular rhythm.     Heart sounds: Normal heart sounds.  Pulmonary:     Effort: Pulmonary effort is normal.     Breath sounds: Normal breath sounds.  Abdominal:     General: Bowel sounds are normal.     Palpations: Abdomen is soft.  Musculoskeletal:        General: No tenderness or deformity. Normal range of motion.     Cervical back: Normal range of motion.   Lymphadenopathy:     Cervical: No cervical adenopathy.  Skin:    General: Skin is warm and dry.     Findings: No erythema or rash.  Neurological:     Mental Status: He is alert and oriented to person, place, and time.  Psychiatric:        Behavior: Behavior normal.        Thought Content: Thought content normal.        Judgment: Judgment normal.     Lab Results  Component Value Date   WBC 5.4 05/16/2023   HGB 13.5 05/16/2023   HCT 39.7 05/16/2023   MCV 94.1 05/16/2023   PLT 164 05/16/2023     Chemistry      Component Value Date/Time   NA 143 05/16/2023 0857   K 4.5 05/16/2023 0857   CL 107 05/16/2023 0857   CO2 28 05/16/2023 0857   BUN 21 05/16/2023 0857   CREATININE 0.92 05/16/2023 0857      Component Value Date/Time   CALCIUM 9.7 05/16/2023 0857   ALKPHOS 52 05/16/2023 0857   AST 21 05/16/2023 0857   ALT 18 05/16/2023 0857   BILITOT 0.5 05/16/2023 0857       Impression and Plan: Frederick Cruz is a 67 year old white male.  He has metastatic prostate cancer.  He clearly has oligometastatic disease.  So far, I think everything is going quite well.  I am glad that his PSA has been so low.  We will go ahead and get him set up with another PET scan.  Will probably do this in about 3 months.  We will also get him set up with a bone density test.  I will think this would be a bad idea.  I know that he will have a great time on the open road.  He and his wife do a lot of camping.  They really enjoyed this.  I am so happy that he can do what he likes.   We will plan to get him back in another 6 months.  Josph Macho, MD 6/11/20249:57 AM

## 2023-05-16 NOTE — Patient Instructions (Signed)
Leuprolide Suspension for Injection (Prostate Cancer) What is this medication? LEUPROLIDE (loo PROE lide) reduces the symptoms of prostate cancer. It works by decreasing levels of the hormone testosterone in the body. This prevents prostate cancer cells from spreading or growing. This medicine may be used for other purposes; ask your health care provider or pharmacist if you have questions. COMMON BRAND NAME(S): Eligard, Lupron Depot, Lupron Depot-Ped, Lutrate Depot, Viadur What should I tell my care team before I take this medication? They need to know if you have any of these conditions: Diabetes Heart disease Heart failure High or low levels of electrolytes, such as magnesium, potassium, or sodium in your blood Irregular heartbeat or rhythm Seizures An unusual or allergic reaction to leuprolide, other medications, foods, dyes, or preservatives Pregnant or trying to get pregnant Breast-feeding How should I use this medication? This medication is injected under the skin or into a muscle. It is given by your care team in a hospital or clinic setting. Talk to your care team about the use of this medication in children. Special care may be needed. Overdosage: If you think you have taken too much of this medicine contact a poison control center or emergency room at once. NOTE: This medicine is only for you. Do not share this medicine with others. What if I miss a dose? Keep appointments for follow-up doses. It is important not to miss your dose. Call your care team if you are unable to keep an appointment. What may interact with this medication? Do not take this medication with any of the following: Cisapride Dronedarone Ketoconazole Levoketoconazole Pimozide Thioridazine This medication may also interact with the following: Other medications that cause heart rhythm changes This list may not describe all possible interactions. Give your health care provider a list of all the medicines,  herbs, non-prescription drugs, or dietary supplements you use. Also tell them if you smoke, drink alcohol, or use illegal drugs. Some items may interact with your medicine. What should I watch for while using this medication? Visit your care team for regular checks on your progress. Tell your care team if your symptoms do not start to get better or if they get worse. This medication may increase blood sugar. The risk may be higher in patients who already have diabetes. Ask your care team what you can do to lower the risk of diabetes while taking this medication. This medication may cause infertility. Talk to your care team if you are concerned about your fertility. Heart attacks and strokes have been reported with the use of this medication. Get emergency help if you develop signs or symptoms of a heart attack or stroke. Talk to your care team about the risks and benefits of this medication. What side effects may I notice from receiving this medication? Side effects that you should report to your care team as soon as possible: Allergic reactions--skin rash, itching, hives, swelling of the face, lips, tongue, or throat Heart attack--pain or tightness in the chest, shoulders, arms, or jaw, nausea, shortness of breath, cold or clammy skin, feeling faint or lightheaded Heart rhythm changes--fast or irregular heartbeat, dizziness, feeling faint or lightheaded, chest pain, trouble breathing High blood sugar (hyperglycemia)--increased thirst or amount of urine, unusual weakness or fatigue, blurry vision Mood swings, irritability, hostility Seizures Stroke--sudden numbness or weakness of the face, arm, or leg, trouble speaking, confusion, trouble walking, loss of balance or coordination, dizziness, severe headache, change in vision Thoughts of suicide or self-harm, worsening mood, feelings of depression Side   effects that usually do not require medical attention (report to your care team if they continue or  are bothersome): Bone pain Change in sex drive or performance General discomfort and fatigue Hot flashes Muscle pain Pain, redness, or irritation at injection site Swelling of the ankles, hands, or feet This list may not describe all possible side effects. Call your doctor for medical advice about side effects. You may report side effects to FDA at 1-800-FDA-1088. Where should I keep my medication? This medication is given in a hospital or clinic. It will not be stored at home. NOTE: This sheet is a summary. It may not cover all possible information. If you have questions about this medicine, talk to your doctor, pharmacist, or health care provider.  2024 Elsevier/Gold Standard (2022-02-02 00:00:00)  

## 2023-05-18 ENCOUNTER — Encounter: Payer: Self-pay | Admitting: *Deleted

## 2023-05-18 ENCOUNTER — Telehealth: Payer: Self-pay | Admitting: *Deleted

## 2023-05-18 LAB — TESTOSTERONE: Testosterone: <3 ng/dL — ABNORMAL LOW (ref 264–916)

## 2023-05-18 LAB — PSA, TOTAL AND FREE
PSA, Free Pct: >70 %
PSA, Free: 0.07 ng/mL
Prostate Specific Ag, Serum: <0.1 ng/mL (ref 0.0–4.0)

## 2023-05-18 NOTE — Telephone Encounter (Signed)
Per 05/16/23 los - called patient and lvm of upcoming appointments and gave number to centralized scheduling for PET Scan - requested call back to confirm.

## 2023-06-01 ENCOUNTER — Ambulatory Visit (HOSPITAL_BASED_OUTPATIENT_CLINIC_OR_DEPARTMENT_OTHER)
Admission: RE | Admit: 2023-06-01 | Discharge: 2023-06-01 | Disposition: A | Discharge status: Home / Self Care | Payer: Medicare Other | Source: Ambulatory Visit | Attending: Hematology & Oncology | Admitting: Hematology & Oncology

## 2023-06-01 DIAGNOSIS — M8588 Other specified disorders of bone density and structure, other site: Secondary | ICD-10-CM

## 2023-06-01 DIAGNOSIS — C61 Malignant neoplasm of prostate: Secondary | ICD-10-CM | POA: Diagnosis present

## 2023-06-01 DIAGNOSIS — C775 Secondary and unspecified malignant neoplasm of intrapelvic lymph nodes: Secondary | ICD-10-CM

## 2023-06-14 ENCOUNTER — Other Ambulatory Visit: Payer: Self-pay | Admitting: Hematology & Oncology

## 2023-06-14 DIAGNOSIS — C775 Secondary and unspecified malignant neoplasm of intrapelvic lymph nodes: Secondary | ICD-10-CM

## 2023-06-21 ENCOUNTER — Telehealth: Payer: Self-pay | Admitting: Hematology & Oncology

## 2023-08-09 ENCOUNTER — Other Ambulatory Visit: Payer: Self-pay | Admitting: Hematology & Oncology

## 2023-08-09 DIAGNOSIS — N5231 Erectile dysfunction following radical prostatectomy: Secondary | ICD-10-CM

## 2023-08-09 DIAGNOSIS — C61 Malignant neoplasm of prostate: Secondary | ICD-10-CM

## 2023-08-15 ENCOUNTER — Encounter: Payer: Self-pay | Admitting: Hematology & Oncology

## 2023-08-16 ENCOUNTER — Encounter (HOSPITAL_COMMUNITY)
Admission: RE | Admit: 2023-08-16 | Discharge: 2023-08-16 | Disposition: A | Discharge status: Home / Self Care | Payer: Medicare Other | Source: Ambulatory Visit | Attending: Hematology & Oncology | Admitting: Hematology & Oncology

## 2023-08-16 DIAGNOSIS — C775 Secondary and unspecified malignant neoplasm of intrapelvic lymph nodes: Secondary | ICD-10-CM | POA: Diagnosis present

## 2023-08-16 DIAGNOSIS — C61 Malignant neoplasm of prostate: Secondary | ICD-10-CM | POA: Insufficient documentation

## 2023-08-16 MED ORDER — FLOTUFOLASTAT F 18 GALLIUM 296-5846 MBQ/ML IV SOLN
8.6630 | Freq: Once | INTRAVENOUS | Status: AC
  Administered 2023-08-16: 8.663 via INTRAVENOUS

## 2023-08-17 ENCOUNTER — Telehealth: Payer: Self-pay | Admitting: *Deleted

## 2023-08-17 NOTE — Telephone Encounter (Signed)
As noted below by Dr. Myna Hidalgo, I left a message for the patient informing him that the PET scan does not show any obvious prostate cancer. This is fantastic news! Please keep on traveling and camping. Pete.  Thank You for using MyChart, Rocio Wolak,RN

## 2023-08-17 NOTE — Telephone Encounter (Signed)
-----   Message from Frederick Cruz sent at 08/16/2023  6:12 PM EDT ----- Please call and let him know that the PET scan does not show any obvious prostate cancer.  This is fantastic news.  Tell him to keep on traveling and camping.  Cindee Lame

## 2023-11-15 ENCOUNTER — Inpatient Hospital Stay: Payer: Medicare Other | Attending: Hematology & Oncology

## 2023-11-15 ENCOUNTER — Inpatient Hospital Stay (HOSPITAL_BASED_OUTPATIENT_CLINIC_OR_DEPARTMENT_OTHER): Payer: Medicare Other | Admitting: Hematology & Oncology

## 2023-11-15 ENCOUNTER — Encounter: Payer: Self-pay | Admitting: Hematology & Oncology

## 2023-11-15 ENCOUNTER — Inpatient Hospital Stay: Payer: Medicare Other

## 2023-11-15 VITALS — BP 116/81 | HR 59 | Temp 98.3°F | Resp 18 | Ht 68.5 in | Wt 223.0 lb

## 2023-11-15 DIAGNOSIS — M8588 Other specified disorders of bone density and structure, other site: Secondary | ICD-10-CM

## 2023-11-15 DIAGNOSIS — C61 Malignant neoplasm of prostate: Secondary | ICD-10-CM | POA: Diagnosis present

## 2023-11-15 DIAGNOSIS — Z5111 Encounter for antineoplastic chemotherapy: Secondary | ICD-10-CM | POA: Diagnosis present

## 2023-11-15 DIAGNOSIS — Z79899 Other long term (current) drug therapy: Secondary | ICD-10-CM | POA: Insufficient documentation

## 2023-11-15 DIAGNOSIS — Z79620 Long term (current) use of immunosuppressive biologic: Secondary | ICD-10-CM | POA: Insufficient documentation

## 2023-11-15 DIAGNOSIS — C775 Secondary and unspecified malignant neoplasm of intrapelvic lymph nodes: Secondary | ICD-10-CM | POA: Insufficient documentation

## 2023-11-15 LAB — CBC WITH DIFFERENTIAL (CANCER CENTER ONLY)
Abs Immature Granulocytes: 0.01 10*3/uL (ref 0.00–0.07)
Basophils Absolute: 0.1 10*3/uL (ref 0.0–0.1)
Basophils Relative: 1 %
Eosinophils Absolute: 0.2 10*3/uL (ref 0.0–0.5)
Eosinophils Relative: 5 %
HCT: 39.8 % (ref 39.0–52.0)
Hemoglobin: 13.3 g/dL (ref 13.0–17.0)
Immature Granulocytes: 0 %
Lymphocytes Relative: 42 %
Lymphs Abs: 2.1 10*3/uL (ref 0.7–4.0)
MCH: 31.8 pg (ref 26.0–34.0)
MCHC: 33.4 g/dL (ref 30.0–36.0)
MCV: 95.2 fL (ref 80.0–100.0)
Monocytes Absolute: 0.4 10*3/uL (ref 0.1–1.0)
Monocytes Relative: 7 %
Neutro Abs: 2.2 10*3/uL (ref 1.7–7.7)
Neutrophils Relative %: 45 %
Platelet Count: 166 10*3/uL (ref 150–400)
RBC: 4.18 MIL/uL — ABNORMAL LOW (ref 4.22–5.81)
RDW: 11.9 % (ref 11.5–15.5)
WBC Count: 4.9 10*3/uL (ref 4.0–10.5)
nRBC: 0 % (ref 0.0–0.2)

## 2023-11-15 LAB — CMP (CANCER CENTER ONLY)
ALT: 15 U/L (ref 0–44)
AST: 17 U/L (ref 15–41)
Albumin: 4.4 g/dL (ref 3.5–5.0)
Alkaline Phosphatase: 45 U/L (ref 38–126)
Anion gap: 7 (ref 5–15)
BUN: 21 mg/dL (ref 8–23)
CO2: 30 mmol/L (ref 22–32)
Calcium: 9.5 mg/dL (ref 8.9–10.3)
Chloride: 105 mmol/L (ref 98–111)
Creatinine: 0.92 mg/dL (ref 0.61–1.24)
GFR, Estimated: >60 mL/min (ref >60)
Glucose, Bld: 97 mg/dL (ref 70–99)
Potassium: 4.7 mmol/L (ref 3.5–5.1)
Sodium: 142 mmol/L (ref 135–145)
Total Bilirubin: 0.4 mg/dL (ref <1.2)
Total Protein: 7.1 g/dL (ref 6.5–8.1)

## 2023-11-15 LAB — VITAMIN D 25 HYDROXY (VIT D DEFICIENCY, FRACTURES): Vit D, 25-Hydroxy: 59.82 ng/mL (ref 30–100)

## 2023-11-15 MED ORDER — LEUPROLIDE ACETATE (6 MONTH) 45 MG ~~LOC~~ KIT
45.0000 mg | PACK | Freq: Once | SUBCUTANEOUS | Status: AC
  Administered 2023-11-15: 45 mg via SUBCUTANEOUS
  Filled 2023-11-15: qty 45

## 2023-11-15 NOTE — Patient Instructions (Signed)

## 2023-11-15 NOTE — Progress Notes (Signed)
Hematology and Oncology Follow Up Visit  Frederick Cruz [id number] [birth year] 67 y.o. 11/15/2023   Principle Diagnosis:  Metastatic prostate cancer-castrate responsive  Current Therapy:   Eligard 45 mg subcu every 6 months-next dose in 05/2023 Casodex 50 mg p.o. daily     Interim History:  Mr. Frederick Cruz is back for follow-up.  We see him every 6 months.  Since we last saw him, has been doing quite well.  He and his wife have been doing a lot of traveling.  There were [place] in [place].  There were up in [place].  They really had a wonderful time camping.  His prostate cancer is doing great.  He had a PSMA scan back in September.  Does not show any evidence of metastatic disease.  His last PSA was 0.1.  His last testosterone level less than 3.  He has had no problems with sweats.  He has had no cough or shortness of breath.  He has had no nausea or vomiting.  He has had no change in bowel or bladder habits.  He has had no rashes.  There is been no leg swelling.  He has had no issue with COVID.  Overall, I would say his performance status is ECOG 0.   Medications:  Current Outpatient Medications:    atorvastatin (LIPITOR) 20 MG tablet, Take 20 mg by mouth daily., Disp: , Rfl:    bicalutamide (CASODEX) 50 MG tablet, TAKE 1 TABLET BY MOUTH EVERY DAY, Disp: 90 tablet, Rfl: 4   Calcium-Magnesium 300-300 MG TABS, Take by mouth daily., Disp: , Rfl:    Cholecalciferol 25 MCG (1000 UT) tablet, Take by mouth., Disp: , Rfl:    escitalopram (LEXAPRO) 10 MG tablet, Take 10 mg by mouth daily., Disp: , Rfl:    Glutathione 6 GM/30ML SOLN, Take by mouth., Disp: , Rfl:    metoprolol succinate (TOPROL-XL) 25 MG 24 hr tablet, TAKE 1 TABLET (25 MG TOTAL) BY MOUTH DAILY. NEED OFFICE VISIT, Disp: 30 tablet, Rfl: 0   Multiple Vitamins-Minerals (CENTRUM SILVER 50+MEN PO), Take 1 tablet by mouth daily., Disp: , Rfl:    Omega-3 Fatty Acids (FISH OIL) 1000 MG CAPS, Take 1 capsule by mouth daily., Disp: ,  Rfl:    sildenafil (VIAGRA) 100 MG tablet, TAKE 1 TABLET BY MOUTH DAILY AS NEEDED FOR ERECTILE DYSFUNCTION, Disp: 10 tablet, Rfl: 3   TURMERIC PO, Take by mouth daily., Disp: , Rfl:    vitamin C (ASCORBIC ACID) 500 MG tablet, Take 500 mg by mouth daily., Disp: , Rfl:    vitamin E 400 UNIT capsule, Take 400 Units by mouth daily., Disp: , Rfl:    GLUTATHIONE PO, Take by mouth daily., Disp: , Rfl:   Allergies:  Allergies  Allergen Reactions   Clonazepam Other (See Comments)    Double vision   Trazodone Other (See Comments)    Patient doesn't remember    Past Medical History, Surgical history, Social history, and Family History were reviewed and updated.  Review of Systems: Review of Systems  Constitutional: Negative.   HENT:  Negative.    Eyes: Negative.   Respiratory: Negative.    Cardiovascular: Negative.   Gastrointestinal: Negative.   Endocrine: Positive for hot flashes.  Genitourinary: Negative.    Musculoskeletal: Negative.   Skin: Negative.   Neurological: Negative.   Hematological: Negative.   Psychiatric/Behavioral: Negative.      Physical Exam:  height is 5' 8.5" (1.74 m) and weight is 223 lb (101.2 kg).  His oral temperature is 98.3 F (36.8 C). His blood pressure is 116/81 and his pulse is 59 (abnormal). His respiration is 18 and oxygen saturation is 98%.   Wt Readings from Last 3 Encounters:  11/15/23 223 lb (101.2 kg)  05/16/23 223 lb (101.2 kg)  11/14/22 240 lb 6.4 oz (109 kg)    Physical Exam Vitals reviewed.  HENT:     Head: Normocephalic and atraumatic.  Eyes:     Pupils: Pupils are equal, round, and reactive to light.  Cardiovascular:     Rate and Rhythm: Normal rate and regular rhythm.     Heart sounds: Normal heart sounds.  Pulmonary:     Effort: Pulmonary effort is normal.     Breath sounds: Normal breath sounds.  Abdominal:     General: Bowel sounds are normal.     Palpations: Abdomen is soft.  Musculoskeletal:        General: No  tenderness or deformity. Normal range of motion.     Cervical back: Normal range of motion.  Lymphadenopathy:     Cervical: No cervical adenopathy.  Skin:    General: Skin is warm and dry.     Findings: No erythema or rash.  Neurological:     Mental Status: He is alert and oriented to person, place, and time.  Psychiatric:        Behavior: Behavior normal.        Thought Content: Thought content normal.        Judgment: Judgment normal.     Lab Results  Component Value Date   WBC 4.9 11/15/2023   HGB 13.3 11/15/2023   HCT 39.8 11/15/2023   MCV 95.2 11/15/2023   PLT 166 11/15/2023     Chemistry      Component Value Date/Time   NA 143 05/16/2023 0857   K 4.5 05/16/2023 0857   CL 107 05/16/2023 0857   CO2 28 05/16/2023 0857   BUN 21 05/16/2023 0857   CREATININE 0.92 05/16/2023 0857      Component Value Date/Time   CALCIUM 9.7 05/16/2023 0857   ALKPHOS 52 05/16/2023 0857   AST 21 05/16/2023 0857   ALT 18 05/16/2023 0857   BILITOT 0.5 05/16/2023 0857       Impression and Plan: Mr. Frederick Cruz is a 67 year old white male.  He has metastatic prostate cancer.  He clearly has oligometastatic disease.  So far, I think everything is going quite well.  I am glad that his PSA has been so low.  Everything is going well so far.  We will have to see what his PSA is.  We will go ahead with his Lupron today.  We will plan to get him back in 6 more months.   Frederick Macho, MD 12/11/20249:06 AM

## 2023-11-17 ENCOUNTER — Encounter: Payer: Self-pay | Admitting: *Deleted

## 2023-11-17 ENCOUNTER — Telehealth: Payer: Self-pay

## 2023-11-17 LAB — PSA, TOTAL AND FREE
PSA, Free Pct: 90 %
PSA, Free: 0.09 ng/mL
Prostate Specific Ag, Serum: 0.1 ng/mL (ref 0.0–4.0)

## 2023-11-17 LAB — TESTOSTERONE: Testosterone: <3 ng/dL — ABNORMAL LOW (ref 264–916)

## 2023-11-17 NOTE — Telephone Encounter (Signed)
-----   Message from Josph Macho sent at 11/17/2023  7:50 AM EST ----- Call - the PSA is still 0.1  Altamese Cabal Christmas!!   Cindee Lame

## 2023-11-17 NOTE — Telephone Encounter (Signed)
Advised via MyChart.

## 2024-01-12 ENCOUNTER — Other Ambulatory Visit: Payer: Self-pay | Admitting: Hematology & Oncology

## 2024-03-11 ENCOUNTER — Other Ambulatory Visit: Payer: Self-pay | Admitting: Hematology & Oncology

## 2024-03-11 DIAGNOSIS — N5231 Erectile dysfunction following radical prostatectomy: Secondary | ICD-10-CM

## 2024-03-11 DIAGNOSIS — C775 Secondary and unspecified malignant neoplasm of intrapelvic lymph nodes: Secondary | ICD-10-CM

## 2024-03-12 ENCOUNTER — Encounter: Payer: Self-pay | Admitting: Hematology & Oncology

## 2024-05-11 ENCOUNTER — Encounter (HOSPITAL_COMMUNITY): Payer: Self-pay

## 2024-05-11 ENCOUNTER — Emergency Department (HOSPITAL_COMMUNITY)

## 2024-05-11 ENCOUNTER — Observation Stay (HOSPITAL_COMMUNITY)
Admission: EM | Admit: 2024-05-11 | Discharge: 2024-05-12 | Disposition: A | Discharge status: Home / Self Care | Attending: Neurosurgery | Admitting: Neurosurgery

## 2024-05-11 ENCOUNTER — Other Ambulatory Visit: Payer: Self-pay

## 2024-05-11 DIAGNOSIS — Z96651 Presence of right artificial knee joint: Secondary | ICD-10-CM | POA: Insufficient documentation

## 2024-05-11 DIAGNOSIS — S51812A Laceration without foreign body of left forearm, initial encounter: Secondary | ICD-10-CM | POA: Insufficient documentation

## 2024-05-11 DIAGNOSIS — Z87891 Personal history of nicotine dependence: Secondary | ICD-10-CM | POA: Insufficient documentation

## 2024-05-11 DIAGNOSIS — Z8546 Personal history of malignant neoplasm of prostate: Secondary | ICD-10-CM | POA: Insufficient documentation

## 2024-05-11 DIAGNOSIS — I609 Nontraumatic subarachnoid hemorrhage, unspecified: Secondary | ICD-10-CM

## 2024-05-11 DIAGNOSIS — Z23 Encounter for immunization: Secondary | ICD-10-CM | POA: Insufficient documentation

## 2024-05-11 DIAGNOSIS — S065XAA Traumatic subdural hemorrhage with loss of consciousness status unknown, initial encounter: Secondary | ICD-10-CM | POA: Diagnosis not present

## 2024-05-11 DIAGNOSIS — S066XAA Traumatic subarachnoid hemorrhage with loss of consciousness status unknown, initial encounter: Secondary | ICD-10-CM | POA: Diagnosis not present

## 2024-05-11 DIAGNOSIS — S41111A Laceration without foreign body of right upper arm, initial encounter: Secondary | ICD-10-CM | POA: Diagnosis not present

## 2024-05-11 DIAGNOSIS — S0631AA Contusion and laceration of right cerebrum with loss of consciousness status unknown, initial encounter: Secondary | ICD-10-CM | POA: Insufficient documentation

## 2024-05-11 DIAGNOSIS — Z79899 Other long term (current) drug therapy: Secondary | ICD-10-CM | POA: Insufficient documentation

## 2024-05-11 DIAGNOSIS — R519 Headache, unspecified: Secondary | ICD-10-CM | POA: Diagnosis present

## 2024-05-11 LAB — URINALYSIS, ROUTINE W REFLEX MICROSCOPIC
Bilirubin Urine: NEGATIVE
Glucose, UA: NEGATIVE mg/dL
Hgb urine dipstick: NEGATIVE
Ketones, ur: 5 mg/dL — AB
Leukocytes,Ua: NEGATIVE
Nitrite: NEGATIVE
Protein, ur: NEGATIVE mg/dL
Specific Gravity, Urine: 1.012 (ref 1.005–1.030)
pH: 5 (ref 5.0–8.0)

## 2024-05-11 LAB — I-STAT CHEM 8, ED
BUN: 27 mg/dL — ABNORMAL HIGH (ref 8–23)
Calcium, Ion: 1.19 mmol/L (ref 1.15–1.40)
Chloride: 106 mmol/L (ref 98–111)
Creatinine, Ser: 1 mg/dL (ref 0.61–1.24)
Glucose, Bld: 90 mg/dL (ref 70–99)
HCT: 35 % — ABNORMAL LOW (ref 39.0–52.0)
Hemoglobin: 11.9 g/dL — ABNORMAL LOW (ref 13.0–17.0)
Potassium: 3.7 mmol/L (ref 3.5–5.1)
Sodium: 141 mmol/L (ref 135–145)
TCO2: 22 mmol/L (ref 22–32)

## 2024-05-11 LAB — COMPREHENSIVE METABOLIC PANEL WITH GFR
ALT: 32 U/L (ref 0–44)
AST: 44 U/L — ABNORMAL HIGH (ref 15–41)
Albumin: 4.2 g/dL (ref 3.5–5.0)
Alkaline Phosphatase: 43 U/L (ref 38–126)
Anion gap: 11 (ref 5–15)
BUN: 28 mg/dL — ABNORMAL HIGH (ref 8–23)
CO2: 20 mmol/L — ABNORMAL LOW (ref 22–32)
Calcium: 9.2 mg/dL (ref 8.9–10.3)
Chloride: 108 mmol/L (ref 98–111)
Creatinine, Ser: 0.85 mg/dL (ref 0.61–1.24)
GFR, Estimated: >60 mL/min (ref >60)
Glucose, Bld: 95 mg/dL (ref 70–99)
Potassium: 3.7 mmol/L (ref 3.5–5.1)
Sodium: 139 mmol/L (ref 135–145)
Total Bilirubin: 0.8 mg/dL (ref 0.0–1.2)
Total Protein: 6.5 g/dL (ref 6.5–8.1)

## 2024-05-11 LAB — PROTIME-INR
INR: 1 (ref 0.8–1.2)
Prothrombin Time: 13.8 s (ref 11.4–15.2)

## 2024-05-11 LAB — CBC
HCT: 36 % — ABNORMAL LOW (ref 39.0–52.0)
Hemoglobin: 12.4 g/dL — ABNORMAL LOW (ref 13.0–17.0)
MCH: 31.8 pg (ref 26.0–34.0)
MCHC: 34.4 g/dL (ref 30.0–36.0)
MCV: 92.3 fL (ref 80.0–100.0)
Platelets: 153 10*3/uL (ref 150–400)
RBC: 3.9 MIL/uL — ABNORMAL LOW (ref 4.22–5.81)
RDW: 12.4 % (ref 11.5–15.5)
WBC: 6.6 10*3/uL (ref 4.0–10.5)
nRBC: 0 % (ref 0.0–0.2)

## 2024-05-11 LAB — SAMPLE TO BLOOD BANK

## 2024-05-11 LAB — I-STAT CG4 LACTIC ACID, ED: Lactic Acid, Venous: 2.1 mmol/L (ref 0.5–1.9)

## 2024-05-11 LAB — ETHANOL: Alcohol, Ethyl (B): 73 mg/dL — ABNORMAL HIGH (ref <15)

## 2024-05-11 MED ORDER — TETANUS-DIPHTH-ACELL PERTUSSIS 5-2.5-18.5 LF-MCG/0.5 IM SUSY
0.5000 mL | PREFILLED_SYRINGE | Freq: Once | INTRAMUSCULAR | Status: AC
  Administered 2024-05-11: 0.5 mL via INTRAMUSCULAR
  Filled 2024-05-11: qty 0.5

## 2024-05-11 MED ORDER — POLYETHYLENE GLYCOL 3350 17 G PO PACK
17.0000 g | PACK | Freq: Every day | ORAL | Status: DC | PRN
Start: 2024-05-11 — End: 2024-05-12

## 2024-05-11 MED ORDER — PANTOPRAZOLE SODIUM 40 MG PO TBEC
40.0000 mg | DELAYED_RELEASE_TABLET | Freq: Every day | ORAL | Status: DC
  Administered 2024-05-11: 40 mg via ORAL
  Filled 2024-05-11: qty 1

## 2024-05-11 MED ORDER — DOCUSATE SODIUM 100 MG PO CAPS: 100.0000 mg | ORAL_CAPSULE | Freq: Two times a day (BID) | ORAL | Status: DC | PRN

## 2024-05-11 MED ORDER — ACETAMINOPHEN 325 MG PO TABS: 650.0000 mg | ORAL_TABLET | Freq: Once | ORAL | Status: DC

## 2024-05-11 MED ORDER — ONDANSETRON HCL 4 MG/2ML IJ SOLN: 4.0000 mg | Freq: Four times a day (QID) | INTRAMUSCULAR | Status: DC | PRN

## 2024-05-11 MED ORDER — HYDROCODONE-ACETAMINOPHEN 5-325 MG PO TABS
1.0000 | ORAL_TABLET | ORAL | Status: DC | PRN
  Administered 2024-05-12: 1 via ORAL
  Administered 2024-05-12: 2 via ORAL
  Filled 2024-05-11: qty 1
  Filled 2024-05-11: qty 2
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
## 2024-05-11 NOTE — ED Notes (Signed)
C collar removed per MD 
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
## 2024-05-11 NOTE — ED Notes (Signed)
 Pt c collar taken off per Dr.Kingsley order to this RN
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
## 2024-05-11 NOTE — ED Notes (Signed)
 Dr.Kingsley at bedside to update pt and family on results of head CT scan. C collar left on since CT spine has not resulted

## 2024-05-11 NOTE — ED Notes (Signed)
 This RN called CCMD to place pt on monitoring
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
## 2024-05-11 NOTE — ED Notes (Signed)
 Aimee Houseman EMT cleaned and dressed pt skin tears.

## 2024-05-12 ENCOUNTER — Observation Stay (HOSPITAL_COMMUNITY)

## 2024-05-12 DIAGNOSIS — S065XAA Traumatic subdural hemorrhage with loss of consciousness status unknown, initial encounter: Secondary | ICD-10-CM | POA: Diagnosis not present

## 2024-05-12 LAB — MRSA NEXT GEN BY PCR, NASAL: MRSA by PCR Next Gen: NOT DETECTED

## 2024-05-12 LAB — HIV ANTIBODY (ROUTINE TESTING W REFLEX): HIV Screen 4th Generation wRfx: NONREACTIVE

## 2024-05-12 MED ORDER — HYDROCODONE-ACETAMINOPHEN 5-325 MG PO TABS: 1.0000 | ORAL_TABLET | ORAL | 0 refills | Status: AC | PRN

## 2024-05-12 MED ORDER — DOCUSATE SODIUM 100 MG PO CAPS: 100.0000 mg | ORAL_CAPSULE | Freq: Two times a day (BID) | ORAL | 0 refills | Status: AC | PRN

## 2024-05-12 NOTE — Care Management Obs Status (Signed)
 MEDICARE OBSERVATION STATUS NOTIFICATION   Patient Details  Name: Frederick Cruz MRN: [id number] Date of Birth: [birth year]   Medicare Observation Status Notification Given:  Yes    Jannine Meo, RN 05/12/2024, 10:50 AM
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
## 2024-05-15 ENCOUNTER — Inpatient Hospital Stay: Payer: Medicare Other | Attending: Hematology & Oncology

## 2024-05-15 ENCOUNTER — Inpatient Hospital Stay (HOSPITAL_BASED_OUTPATIENT_CLINIC_OR_DEPARTMENT_OTHER): Payer: Medicare Other | Admitting: Hematology & Oncology

## 2024-05-15 ENCOUNTER — Encounter: Payer: Self-pay | Admitting: Hematology & Oncology

## 2024-05-15 ENCOUNTER — Inpatient Hospital Stay: Payer: Medicare Other

## 2024-05-15 VITALS — BP 125/90 | HR 51 | Temp 97.6°F | Resp 20 | Ht 68.5 in | Wt 215.0 lb

## 2024-05-15 DIAGNOSIS — Z79899 Other long term (current) drug therapy: Secondary | ICD-10-CM | POA: Insufficient documentation

## 2024-05-15 DIAGNOSIS — C61 Malignant neoplasm of prostate: Secondary | ICD-10-CM | POA: Insufficient documentation

## 2024-05-15 DIAGNOSIS — Z5111 Encounter for antineoplastic chemotherapy: Secondary | ICD-10-CM | POA: Diagnosis present

## 2024-05-15 DIAGNOSIS — C775 Secondary and unspecified malignant neoplasm of intrapelvic lymph nodes: Secondary | ICD-10-CM

## 2024-05-15 LAB — CBC WITH DIFFERENTIAL (CANCER CENTER ONLY)
Abs Immature Granulocytes: 0.02 10*3/uL (ref 0.00–0.07)
Basophils Absolute: 0.1 10*3/uL (ref 0.0–0.1)
Basophils Relative: 1 %
Eosinophils Absolute: 0.2 10*3/uL (ref 0.0–0.5)
Eosinophils Relative: 3 %
HCT: 36.8 % — ABNORMAL LOW (ref 39.0–52.0)
Hemoglobin: 12.5 g/dL — ABNORMAL LOW (ref 13.0–17.0)
Immature Granulocytes: 0 %
Lymphocytes Relative: 26 %
Lymphs Abs: 1.9 10*3/uL (ref 0.7–4.0)
MCH: 31.2 pg (ref 26.0–34.0)
MCHC: 34 g/dL (ref 30.0–36.0)
MCV: 91.8 fL (ref 80.0–100.0)
Monocytes Absolute: 0.7 10*3/uL (ref 0.1–1.0)
Monocytes Relative: 9 %
Neutro Abs: 4.2 10*3/uL (ref 1.7–7.7)
Neutrophils Relative %: 61 %
Platelet Count: 157 10*3/uL (ref 150–400)
RBC: 4.01 MIL/uL — ABNORMAL LOW (ref 4.22–5.81)
RDW: 12.4 % (ref 11.5–15.5)
WBC Count: 7.1 10*3/uL (ref 4.0–10.5)
nRBC: 0 % (ref 0.0–0.2)

## 2024-05-15 LAB — CMP (CANCER CENTER ONLY)
ALT: 19 U/L (ref 0–44)
AST: 22 U/L (ref 15–41)
Albumin: 4.5 g/dL (ref 3.5–5.0)
Alkaline Phosphatase: 43 U/L (ref 38–126)
Anion gap: 8 (ref 5–15)
BUN: 18 mg/dL (ref 8–23)
CO2: 29 mmol/L (ref 22–32)
Calcium: 9.5 mg/dL (ref 8.9–10.3)
Chloride: 106 mmol/L (ref 98–111)
Creatinine: 0.99 mg/dL (ref 0.61–1.24)
GFR, Estimated: >60 mL/min (ref >60)
Glucose, Bld: 109 mg/dL — ABNORMAL HIGH (ref 70–99)
Potassium: 5.4 mmol/L — ABNORMAL HIGH (ref 3.5–5.1)
Sodium: 143 mmol/L (ref 135–145)
Total Bilirubin: 0.5 mg/dL (ref 0.0–1.2)
Total Protein: 7.1 g/dL (ref 6.5–8.1)

## 2024-05-15 MED ORDER — LEUPROLIDE ACETATE (6 MONTH) 45 MG ~~LOC~~ KIT
45.0000 mg | PACK | Freq: Once | SUBCUTANEOUS | Status: AC
  Administered 2024-05-15: 45 mg via SUBCUTANEOUS
  Filled 2024-05-15: qty 45

## 2024-05-15 NOTE — Patient Instructions (Signed)
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
## 2024-05-16 ENCOUNTER — Ambulatory Visit: Payer: Self-pay | Admitting: Hematology & Oncology

## 2024-05-16 LAB — PSA, TOTAL AND FREE
PSA, Free Pct: 85 %
PSA, Free: 0.17 ng/mL
Prostate Specific Ag, Serum: 0.2 ng/mL (ref 0.0–4.0)

## 2024-05-16 LAB — TESTOSTERONE: Testosterone: <3 ng/dL — ABNORMAL LOW (ref 264–916)

## 2024-07-18 ENCOUNTER — Other Ambulatory Visit: Payer: Self-pay

## 2024-07-18 ENCOUNTER — Encounter (HOSPITAL_COMMUNITY): Payer: Self-pay

## 2024-07-18 ENCOUNTER — Emergency Department (HOSPITAL_COMMUNITY)
Admission: EM | Admit: 2024-07-18 | Discharge: 2024-07-18 | Disposition: A | Discharge status: Home / Self Care | Attending: Emergency Medicine | Admitting: Emergency Medicine

## 2024-07-18 ENCOUNTER — Emergency Department (HOSPITAL_COMMUNITY)

## 2024-07-18 DIAGNOSIS — Z96653 Presence of artificial knee joint, bilateral: Secondary | ICD-10-CM | POA: Diagnosis not present

## 2024-07-18 DIAGNOSIS — S065X0A Traumatic subdural hemorrhage without loss of consciousness, initial encounter: Secondary | ICD-10-CM | POA: Insufficient documentation

## 2024-07-18 DIAGNOSIS — X58XXXA Exposure to other specified factors, initial encounter: Secondary | ICD-10-CM | POA: Diagnosis not present

## 2024-07-18 DIAGNOSIS — S065XAA Traumatic subdural hemorrhage with loss of consciousness status unknown, initial encounter: Secondary | ICD-10-CM

## 2024-07-18 DIAGNOSIS — Z8546 Personal history of malignant neoplasm of prostate: Secondary | ICD-10-CM | POA: Insufficient documentation

## 2024-07-18 DIAGNOSIS — R42 Dizziness and giddiness: Secondary | ICD-10-CM | POA: Diagnosis present

## 2024-07-18 LAB — COMPREHENSIVE METABOLIC PANEL WITH GFR
ALT: 22 U/L (ref 0–44)
AST: 26 U/L (ref 15–41)
Albumin: 4 g/dL (ref 3.5–5.0)
Alkaline Phosphatase: 44 U/L (ref 38–126)
Anion gap: 10 (ref 5–15)
BUN: 23 mg/dL (ref 8–23)
CO2: 20 mmol/L — ABNORMAL LOW (ref 22–32)
Calcium: 9.3 mg/dL (ref 8.9–10.3)
Chloride: 110 mmol/L (ref 98–111)
Creatinine, Ser: 0.91 mg/dL (ref 0.61–1.24)
GFR, Estimated: >60 mL/min (ref >60)
Glucose, Bld: 127 mg/dL — ABNORMAL HIGH (ref 70–99)
Potassium: 4.1 mmol/L (ref 3.5–5.1)
Sodium: 140 mmol/L (ref 135–145)
Total Bilirubin: 0.8 mg/dL (ref 0.0–1.2)
Total Protein: 6.8 g/dL (ref 6.5–8.1)

## 2024-07-18 LAB — URINALYSIS, ROUTINE W REFLEX MICROSCOPIC
Bilirubin Urine: NEGATIVE
Glucose, UA: NEGATIVE mg/dL
Hgb urine dipstick: NEGATIVE
Ketones, ur: 5 mg/dL — AB
Leukocytes,Ua: NEGATIVE
Nitrite: NEGATIVE
Protein, ur: NEGATIVE mg/dL
Specific Gravity, Urine: 1.023 (ref 1.005–1.030)
pH: 5 (ref 5.0–8.0)

## 2024-07-18 LAB — CBC
HCT: 38.3 % — ABNORMAL LOW (ref 39.0–52.0)
Hemoglobin: 12.7 g/dL — ABNORMAL LOW (ref 13.0–17.0)
MCH: 31.4 pg (ref 26.0–34.0)
MCHC: 33.2 g/dL (ref 30.0–36.0)
MCV: 94.6 fL (ref 80.0–100.0)
Platelets: 177 K/uL (ref 150–400)
RBC: 4.05 MIL/uL — ABNORMAL LOW (ref 4.22–5.81)
RDW: 12.1 % (ref 11.5–15.5)
WBC: 6.2 K/uL (ref 4.0–10.5)
nRBC: 0 % (ref 0.0–0.2)

## 2024-07-18 LAB — I-STAT CHEM 8, ED
BUN: 25 mg/dL — ABNORMAL HIGH (ref 8–23)
Calcium, Ion: 1.06 mmol/L — ABNORMAL LOW (ref 1.15–1.40)
Chloride: 109 mmol/L (ref 98–111)
Creatinine, Ser: 0.9 mg/dL (ref 0.61–1.24)
Glucose, Bld: 126 mg/dL — ABNORMAL HIGH (ref 70–99)
HCT: 36 % — ABNORMAL LOW (ref 39.0–52.0)
Hemoglobin: 12.2 g/dL — ABNORMAL LOW (ref 13.0–17.0)
Potassium: 3.9 mmol/L (ref 3.5–5.1)
Sodium: 139 mmol/L (ref 135–145)
TCO2: 21 mmol/L — ABNORMAL LOW (ref 22–32)

## 2024-07-18 LAB — PROTIME-INR
INR: 1 (ref 0.8–1.2)
Prothrombin Time: 13.8 s (ref 11.4–15.2)

## 2024-07-18 LAB — APTT: aPTT: 29 s (ref 24–36)
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
## 2024-07-18 NOTE — Discharge Instructions (Signed)
 You have been seen and discharged from the emergency department.  Your CT was found to have subdural hematomas.  Neurosurgery was consulted.  You spoke with Dr. Gillie.  He will follow-up with him in his office as an outpatient next week.  Follow-up with your primary provider for further evaluation and further care. Take home medications as prescribed. If you have any worsening symptoms or further concerns for your health please return to an emergency department for further evaluation.

## 2024-07-18 NOTE — ED Provider Triage Note (Signed)
 Emergency Medicine Provider Triage Evaluation Note  Frederick Cruz , a 68 y.o. male  was evaluated in triage.  Pt complains of changes in equilibrium, worsening dizziness, imaging from today shows worsening / possible new subdural hematoma. No new vision changes, weakness, or numbness, no new head injury, no blood thinners.  Review of Systems  Positive: Balance issue Negative:   Physical Exam  BP (!) 145/85   Pulse (!) 56   Temp 97.7 F (36.5 C) (Oral)   Resp 14   Ht 5' 9 (1.753 m)   Wt 97.5 kg   SpO2 97%   BMI 31.75 kg/m  Gen:   Awake, no distress   Resp:  Normal effort  MSK:   Moves extremities without difficulty  Other:  Moves all 4 limbs spontaneously, CN II through XII grossly intact, can ambulate without difficulty, intact sensation throughout.  Medical Decision Making  Medically screening exam initiated at 6:27 PM.  Appropriate orders placed.  Frederick Cruz was informed that the remainder of the evaluation will be completed by another provider, this initial triage assessment does not replace that evaluation, and the importance of remaining in the ED until their evaluation is complete.  Outside report:  Brain: Bilateral cerebral convexity mixed attenuation, though predominantly hypoattenuating, subdural hematomas measuring up to 8 mm in maximal coronal thickness on the right and up to 11 mm on the left.  Per outside report, the right subdural previously measured 14mm. However, the left cerebral convexity subdural is new. There is focal mass effect with sulcal effacement on the left, but no midline shift. There is an additional focus of hypoattenuation, likely encephalomalacia, in the right superior frontal gyrus that by outside report previously was an area of intraparenchymal hemorrhage. No acute large vascular territory infarct.No hydrocephalus. Patchy areas of hypoattenuation in the periventricular and subcortical white matter likely represent changes of chronic small  vessel ischemic disease. Diffuse cerebral volume loss with ex vacuo expansion of the ventricles. Intracranial calcified atherosclerosis.    Frederick Cruz, [place] 07/18/24 1827
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
## 2024-07-18 NOTE — Telephone Encounter (Signed)
 Spoke with patient's wife Particia regarding critical CT head results showing new left subdural hematoma.  Recommended patient report to emergency department for further evaluation.  He has no new acute neurologic symptoms according to his wife, no recent head trauma, not on blood thinners.  My CMA will notify emergency department of his anticipated arrival.  I spoke with [place] neurosurgery regarding new findings, they will pass off to on-call provider.

## 2024-11-07 ENCOUNTER — Other Ambulatory Visit: Payer: Self-pay | Admitting: Hematology & Oncology

## 2024-11-07 DIAGNOSIS — C61 Malignant neoplasm of prostate: Secondary | ICD-10-CM

## 2024-11-07 DIAGNOSIS — N5231 Erectile dysfunction following radical prostatectomy: Secondary | ICD-10-CM

## 2024-11-15 ENCOUNTER — Other Ambulatory Visit: Payer: Self-pay

## 2024-11-15 ENCOUNTER — Ambulatory Visit

## 2024-11-15 ENCOUNTER — Encounter: Payer: Self-pay | Admitting: Hematology & Oncology

## 2024-11-15 ENCOUNTER — Inpatient Hospital Stay: Attending: Hematology & Oncology

## 2024-11-15 ENCOUNTER — Inpatient Hospital Stay: Admitting: Hematology & Oncology

## 2024-11-15 VITALS — BP 122/72 | HR 49 | Temp 98.4°F | Resp 16 | Ht 69.0 in | Wt 213.0 lb

## 2024-11-15 DIAGNOSIS — C799 Secondary malignant neoplasm of unspecified site: Secondary | ICD-10-CM | POA: Diagnosis not present

## 2024-11-15 DIAGNOSIS — E895 Postprocedural testicular hypofunction: Secondary | ICD-10-CM | POA: Insufficient documentation

## 2024-11-15 DIAGNOSIS — C61 Malignant neoplasm of prostate: Secondary | ICD-10-CM

## 2024-11-15 DIAGNOSIS — Z79818 Long term (current) use of other agents affecting estrogen receptors and estrogen levels: Secondary | ICD-10-CM | POA: Insufficient documentation

## 2024-11-15 DIAGNOSIS — C775 Secondary and unspecified malignant neoplasm of intrapelvic lymph nodes: Secondary | ICD-10-CM

## 2024-11-15 DIAGNOSIS — Z5111 Encounter for antineoplastic chemotherapy: Secondary | ICD-10-CM | POA: Diagnosis present

## 2024-11-15 DIAGNOSIS — E291 Testicular hypofunction: Secondary | ICD-10-CM | POA: Diagnosis not present

## 2024-11-15 DIAGNOSIS — C779 Secondary and unspecified malignant neoplasm of lymph node, unspecified: Secondary | ICD-10-CM | POA: Diagnosis present

## 2024-11-15 DIAGNOSIS — Z79899 Other long term (current) drug therapy: Secondary | ICD-10-CM | POA: Insufficient documentation

## 2024-11-15 LAB — CBC WITH DIFFERENTIAL (CANCER CENTER ONLY)
Abs Immature Granulocytes: 0 K/uL (ref 0.00–0.07)
Basophils Absolute: 0 K/uL (ref 0.0–0.1)
Basophils Relative: 1 %
Eosinophils Absolute: 0.2 K/uL (ref 0.0–0.5)
Eosinophils Relative: 5 %
HCT: 39.3 % (ref 39.0–52.0)
Hemoglobin: 13.6 g/dL (ref 13.0–17.0)
Immature Granulocytes: 0 %
Lymphocytes Relative: 38 %
Lymphs Abs: 1.9 K/uL (ref 0.7–4.0)
MCH: 31.1 pg (ref 26.0–34.0)
MCHC: 34.6 g/dL (ref 30.0–36.0)
MCV: 89.7 fL (ref 80.0–100.0)
Monocytes Absolute: 0.5 K/uL (ref 0.1–1.0)
Monocytes Relative: 10 %
Neutro Abs: 2.3 K/uL (ref 1.7–7.7)
Neutrophils Relative %: 46 %
Platelet Count: 158 K/uL (ref 150–400)
RBC: 4.38 MIL/uL (ref 4.22–5.81)
RDW: 12.1 % (ref 11.5–15.5)
WBC Count: 5 K/uL (ref 4.0–10.5)
nRBC: 0 % (ref 0.0–0.2)

## 2024-11-15 LAB — CMP (CANCER CENTER ONLY)
ALT: 42 U/L (ref 0–44)
AST: 33 U/L (ref 15–41)
Albumin: 4.7 g/dL (ref 3.5–5.0)
Alkaline Phosphatase: 61 U/L (ref 38–126)
Anion gap: 8 (ref 5–15)
BUN: 21 mg/dL (ref 8–23)
CO2: 28 mmol/L (ref 22–32)
Calcium: 9.4 mg/dL (ref 8.9–10.3)
Chloride: 106 mmol/L (ref 98–111)
Creatinine: 0.95 mg/dL (ref 0.61–1.24)
GFR, Estimated: >60 mL/min (ref >60)
Glucose, Bld: 96 mg/dL (ref 70–99)
Potassium: 4.8 mmol/L (ref 3.5–5.1)
Sodium: 142 mmol/L (ref 135–145)
Total Bilirubin: 0.5 mg/dL (ref 0.0–1.2)
Total Protein: 7.1 g/dL (ref 6.5–8.1)

## 2024-11-15 MED ORDER — LEUPROLIDE ACETATE (6 MONTH) 45 MG ~~LOC~~ KIT
45.0000 mg | PACK | Freq: Once | SUBCUTANEOUS | Status: AC
  Administered 2024-11-15: 45 mg via SUBCUTANEOUS
  Filled 2024-11-15: qty 45

## 2024-11-15 NOTE — Patient Instructions (Signed)
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
## 2024-11-16 LAB — PSA, TOTAL AND FREE
PSA, Free Pct: 60 %
PSA, Free: 0.12 ng/mL
Prostate Specific Ag, Serum: 0.2 ng/mL (ref 0.0–4.0)

## 2024-11-16 LAB — TESTOSTERONE: Testosterone: <3 ng/dL — ABNORMAL LOW (ref 264–916)

## 2024-11-18 ENCOUNTER — Ambulatory Visit: Payer: Self-pay | Admitting: Hematology & Oncology

## 2025-05-16 ENCOUNTER — Inpatient Hospital Stay

## 2025-05-16 ENCOUNTER — Inpatient Hospital Stay: Admitting: Hematology & Oncology
